# Patient Record
Sex: Female | Born: 1960 | Race: White | Hispanic: No | Marital: Married | Smoking: Never smoker
Health system: Southern US, Community
[De-identification: ages and names within clinical notes are randomized; demographics above are authoritative.]

## PROBLEM LIST (undated history)

## (undated) DIAGNOSIS — D649 Anemia, unspecified: Secondary | ICD-10-CM

## (undated) DIAGNOSIS — Z780 Asymptomatic menopausal state: Secondary | ICD-10-CM

## (undated) DIAGNOSIS — M7730 Calcaneal spur, unspecified foot: Secondary | ICD-10-CM

## (undated) DIAGNOSIS — N950 Postmenopausal bleeding: Principal | ICD-10-CM

## (undated) DIAGNOSIS — B001 Herpesviral vesicular dermatitis: Principal | ICD-10-CM

## (undated) DIAGNOSIS — E663 Overweight: Secondary | ICD-10-CM

## (undated) DIAGNOSIS — Z8619 Personal history of other infectious and parasitic diseases: Secondary | ICD-10-CM

## (undated) DIAGNOSIS — E785 Hyperlipidemia, unspecified: Secondary | ICD-10-CM

## (undated) DIAGNOSIS — Z87442 Personal history of urinary calculi: Secondary | ICD-10-CM

## (undated) DIAGNOSIS — N189 Chronic kidney disease, unspecified: Secondary | ICD-10-CM

## (undated) HISTORY — DX: Chronic kidney disease, unspecified: N18.9

## (undated) HISTORY — DX: Personal history of other infectious and parasitic diseases: Z86.19

## (undated) HISTORY — DX: Overweight: E66.3

## (undated) HISTORY — PX: POLYPECTOMY: SHX149

## (undated) HISTORY — DX: Asymptomatic menopausal state: Z78.0

## (undated) HISTORY — DX: Herpesviral vesicular dermatitis: B00.1

## (undated) HISTORY — DX: Personal history of urinary calculi: Z87.442

## (undated) HISTORY — PX: TONSILLECTOMY: SUR1361

## (undated) HISTORY — PX: TUBAL LIGATION: SHX77

## (undated) HISTORY — DX: Hyperlipidemia, unspecified: E78.5

## (undated) HISTORY — DX: Calcaneal spur, unspecified foot: M77.30

## (undated) HISTORY — DX: Postmenopausal bleeding: N95.0

## (undated) HISTORY — DX: Anemia, unspecified: D64.9

---

## 1996-12-30 HISTORY — PX: HEEL SPUR SURGERY: SHX665

## 1999-08-06 ENCOUNTER — Other Ambulatory Visit: Admission: RE | Admit: 1999-08-06 | Discharge: 1999-08-06 | Payer: Self-pay | Admitting: Obstetrics and Gynecology

## 1999-08-24 ENCOUNTER — Ambulatory Visit (HOSPITAL_COMMUNITY): Admission: RE | Admit: 1999-08-24 | Discharge: 1999-08-24 | Payer: Self-pay | Admitting: Obstetrics and Gynecology

## 2000-09-22 ENCOUNTER — Other Ambulatory Visit: Admission: RE | Admit: 2000-09-22 | Discharge: 2000-09-22 | Payer: Self-pay | Admitting: Obstetrics and Gynecology

## 2001-11-11 ENCOUNTER — Other Ambulatory Visit: Admission: RE | Admit: 2001-11-11 | Discharge: 2001-11-11 | Payer: Self-pay | Admitting: Obstetrics and Gynecology

## 2002-12-09 ENCOUNTER — Other Ambulatory Visit: Admission: RE | Admit: 2002-12-09 | Discharge: 2002-12-09 | Payer: Self-pay | Admitting: Obstetrics and Gynecology

## 2004-04-19 ENCOUNTER — Other Ambulatory Visit: Admission: RE | Admit: 2004-04-19 | Discharge: 2004-04-19 | Payer: Self-pay | Admitting: Obstetrics and Gynecology

## 2005-06-17 ENCOUNTER — Other Ambulatory Visit: Admission: RE | Admit: 2005-06-17 | Discharge: 2005-06-17 | Payer: Self-pay | Admitting: Obstetrics and Gynecology

## 2006-07-29 ENCOUNTER — Other Ambulatory Visit: Admission: RE | Admit: 2006-07-29 | Discharge: 2006-07-29 | Payer: Self-pay | Admitting: Obstetrics and Gynecology

## 2007-08-18 ENCOUNTER — Other Ambulatory Visit: Admission: RE | Admit: 2007-08-18 | Discharge: 2007-08-18 | Payer: Self-pay | Admitting: Obstetrics and Gynecology

## 2008-11-17 ENCOUNTER — Other Ambulatory Visit: Admission: RE | Admit: 2008-11-17 | Discharge: 2008-11-17 | Payer: Self-pay | Admitting: Family Medicine

## 2011-03-25 ENCOUNTER — Ambulatory Visit (INDEPENDENT_AMBULATORY_CARE_PROVIDER_SITE_OTHER): Payer: 59 | Admitting: Family Medicine

## 2011-03-25 ENCOUNTER — Encounter: Payer: Self-pay | Admitting: Family Medicine

## 2011-03-25 ENCOUNTER — Inpatient Hospital Stay (HOSPITAL_COMMUNITY): Admission: RE | Admit: 2011-03-25 | Payer: Self-pay | Source: Ambulatory Visit

## 2011-03-25 DIAGNOSIS — M7730 Calcaneal spur, unspecified foot: Secondary | ICD-10-CM | POA: Insufficient documentation

## 2011-03-25 DIAGNOSIS — Z0000 Encounter for general adult medical examination without abnormal findings: Secondary | ICD-10-CM | POA: Insufficient documentation

## 2011-03-25 DIAGNOSIS — E663 Overweight: Secondary | ICD-10-CM | POA: Insufficient documentation

## 2011-03-25 DIAGNOSIS — Z87442 Personal history of urinary calculi: Secondary | ICD-10-CM | POA: Insufficient documentation

## 2011-03-25 DIAGNOSIS — Z780 Asymptomatic menopausal state: Secondary | ICD-10-CM | POA: Insufficient documentation

## 2011-03-25 LAB — BASIC METABOLIC PANEL
BUN: 16 mg/dL (ref 6–23)
CO2: 28 mEq/L (ref 19–32)
Calcium: 9.9 mg/dL (ref 8.4–10.5)
GFR: 87.45 mL/min (ref >60.00)
Glucose, Bld: 94 mg/dL (ref 70–99)
Sodium: 140 mEq/L (ref 135–145)

## 2011-03-25 LAB — CBC WITH DIFFERENTIAL/PLATELET
Basophils Absolute: 0 10*3/uL (ref 0.0–0.1)
Basophils Relative: 0.9 % (ref 0.0–3.0)
Eosinophils Absolute: 0.1 10*3/uL (ref 0.0–0.7)
Lymphocytes Relative: 40.8 % (ref 12.0–46.0)
MCHC: 34.1 g/dL (ref 30.0–36.0)
Neutrophils Relative %: 47.9 % (ref 43.0–77.0)
RBC: 4.29 Mil/uL (ref 3.87–5.11)
WBC: 4.6 10*3/uL (ref 4.5–10.5)

## 2011-03-25 LAB — LIPID PANEL
HDL: 74.5 mg/dL (ref >39.00)
Triglycerides: 72 mg/dL (ref 0.0–149.0)

## 2011-03-25 LAB — HEPATIC FUNCTION PANEL
ALT: 10 U/L (ref 0–35)
Bilirubin, Direct: 0.1 mg/dL (ref 0.0–0.3)
Total Protein: 6.5 g/dL (ref 6.0–8.3)

## 2011-03-25 LAB — LDL CHOLESTEROL, DIRECT: Direct LDL: 115 mg/dL

## 2011-03-25 NOTE — Assessment & Plan Note (Signed)
Patient underwent her last period in early 2011 and offers no concerns today. Last pap was just over a year ago will request old records and repeat pap today if these results are normal we will decrease frequency of paps to every 2-3 years. Patient MGM is not due until 11/12.

## 2011-03-25 NOTE — Assessment & Plan Note (Signed)
Encouraged daily ice, Aspercreme, stretching and hi quality footwear. May need further intervention if symptoms worsen or do not improve.

## 2011-03-25 NOTE — Assessment & Plan Note (Signed)
Encouraged ongoing increased activity level.

## 2011-03-25 NOTE — Assessment & Plan Note (Signed)
No recent flares, encouraged ongoing good hydration and to report any concerning symptoms

## 2011-03-25 NOTE — Patient Instructions (Signed)
Osteoarthritis Osteoarthritis is the most common form of arthritis. It is redness, soreness, and swelling (inflammation) affecting the cartilage. Cartilage acts as a cushion, covering the ends of bones where they meet to form a joint. CAUSES Over time, the cartilage begins to wear away. This causes bone to rub on bone. This produces pain and stiffness in the affected joints. Factors that contribute to this problem are:  Excessive body weight.   Age.   Overuse of joints.  SYMPTOMS  People with osteoarthritis usually experience joint pain, swelling, or stiffness.   Over time, the joint may lose its normal shape.   Small deposits of bone (osteophytes) may grow on the edges of the joint.   Bits of bone or cartilage can break off and float inside the joint space. This may cause more pain and damage.   Osteoarthritis can lead to depression, anxiety, feelings of helplessness, and limitations on daily activities.  The most commonly affected joints are in the:  Ends of the fingers.   Thumbs.   Neck.   Lower back.   Knees.   Hips.  DIAGNOSIS Diagnosis is mostly based on your symptoms and exam. Tests may be helpful, including:  X-rays of the affected joint.   A computerized magnetic scan (MRI).   Blood tests to rule out other types of arthritis.   Joint fluid tests. This involves using a needle to draw fluid from the joint and examining the fluid under a microscope.  TREATMENT Goals of treatment are to control pain, improve joint function, maintain a normal body weight, and maintain a healthy lifestyle. Treatment approaches may include:  A prescribed exercise program with rest and joint relief.   Weight control with nutritional education.   Pain relief techniques such as:   Properly applied heat and cold.   Electric pulses delivered to nerve endings under the skin (transcutaneous electrical nerve stimulation, TENS).   Massage.   Certain supplements. Ask your caregiver  before using any supplements, especially in combination with prescribed drugs.   Medicines to control pain, such as:   Acetaminophen.   Nonsteroidal anti-inflammatory drugs (NSAIDs), such as naproxen.   Narcotic or central-acting agents, such as tramadol. This drug carries a risk of addiction and is generally prescribed for short-term use.   Corticosteroids. These can be given orally or as injection. This is a short-term treatment, not recommended for routine use.   Surgery to reposition the bones and relieve pain (osteotomy) or to remove loose pieces of bone and cartilage. Joint replacement may be needed in advanced states of osteoarthritis.  HOME CARE INSTRUCTIONS Your caregiver can recommend specific types of exercise. These may include:  Strengthening exercises. These are done to strengthen the muscles that support joints affected by arthritis. They can be performed with weights or with exercise bands to add resistance.   Aerobic activities. These are exercises, such as brisk walking or low-impact aerobics, that get your heart pumping. They can help keep your lungs and circulatory system in shape.   Range-of-motion activities. These keep your joints limber.   Balance and agility exercises. These help you maintain daily living skills.  Learning about your condition and being actively involved in your care will help improve the course of your osteoarthritis. SEEK MEDICAL CARE IF:  You feel hot or your skin turns red.   You develop a rash in addition to your joint pain.   You have an oral temperature above 104.  FOR MORE INFORMATION National Institute of Arthritis and Musculoskeletal and  Skin Diseases: www.niams.http://www.myers.net/ [employer] on Aging: https://walker.com/ American College of Rheumatology: www.rheumatology.org Document Released: 12/16/2005 Document Re-Released: 06/05/2010 [facility] Patient Information 2011 [place], [place].

## 2011-03-25 NOTE — Progress Notes (Signed)
Subjective:    Patient ID: Kristen Dunlap, female    DOB: 1961/01/16, 50 y.o.   MRN: [id number]  HPI  The patient is a 50 year old, Caucasian female in today for new patient appointment for Pap smear.  She is feeling well and her free no acute complaints at this time. No breasts lesions pain or discharge. No rectal lesions discharge or concerns. No recent illness, fevers, chills, chest pain, palpitations, shortness of breath , GI or GU concerns at this time. She does note morning stiffness and joint pains occasionally but nothing debilitating. She does note a history of kidney stones but no recent flares. She does note some heel pain on the right foot ongoing for several months and has not tried any interventions to date. Wears largely footwear without backs.,   Review of Systems  Constitutional: Negative for fever, chills, activity change, appetite change and fatigue.  HENT: Negative for hearing loss, nosebleeds, congestion, rhinorrhea, sneezing, neck pain, neck stiffness and postnasal drip.   Eyes: Negative for discharge and itching.  Respiratory: Negative for apnea, cough, choking, chest tightness, shortness of breath and wheezing.   Cardiovascular: Negative for chest pain and palpitations.  Gastrointestinal: Negative for nausea, abdominal pain, diarrhea, constipation, blood in stool and abdominal distention.  Genitourinary: Negative for dysuria, urgency, frequency, vaginal bleeding, vaginal discharge, enuresis, vaginal pain, menstrual problem and pelvic pain.       G2P2 s/p 2 C/S and a tubal. No h/o abnl paps No h/o abnl MGM, did require 2nd view once Postmenopausal since early 2011 Menarche in midteens, regular once monthly menses for most of reproductive years.  Musculoskeletal: Negative for myalgias, back pain, joint swelling, arthralgias and gait problem.  Skin: Negative for rash.  Neurological: Negative for dizziness, syncope, weakness and headaches.  Hematological: Does not  bruise/bleed easily.  Psychiatric/Behavioral: Negative for behavioral problems, confusion, self-injury, dysphoric mood and agitation. The patient is not nervous/anxious.    Past Medical History  Diagnosis Date  . Personal history of kidney stones   . Heel spur     right  . Overweight   . Postmenopausal     Past Surgical History  Procedure Date  . Heel spur surgery 98  . Tubes tied   . Cesarean section 1989 and 1991    X 2    Family History  Problem Relation Age of Onset  . Diabetes Father   . Cancer Father     throat ca/ smoker  . Cancer Paternal Grandmother     esophagus, cancer    History   Social History  . Marital Status: Married    Spouse Name: N/A    Number of Children: N/A  . Years of Education: N/A   Occupational History  . Not on file.   Social History Main Topics  . Smoking status: Never Smoker   . Smokeless tobacco: Never Used  . Alcohol Use: Yes     occasionaly  . Drug Use: No  . Sexually Active: Yes -- Female partner(s)     tubes tied   Other Topics Concern  . Not on file   Social History Narrative  . No narrative on file    No current outpatient prescriptions on file prior to visit.    Not on File     Objective:   Physical Exam  Constitutional: She is oriented to person, place, and time. She appears well-developed and well-nourished. No distress.  HENT:  Head: Normocephalic and atraumatic.  Right Ear: External ear normal.  Left Ear: External ear normal.  Nose: Nose normal.  Mouth/Throat: Oropharynx is clear and moist.  Eyes: Conjunctivae and EOM are normal. Pupils are equal, round, and reactive to light. Right eye exhibits no discharge. Left eye exhibits no discharge.  Neck: Normal range of motion. Neck supple. Thyromegaly present.  Cardiovascular: Normal rate, regular rhythm, normal heart sounds and intact distal pulses.   No murmur heard. Pulmonary/Chest: Effort normal and breath sounds normal. No respiratory distress. She has  no wheezes.  Abdominal: Soft. Bowel sounds are normal. She exhibits no distension and no mass. There is no tenderness. There is no rebound and no guarding.  Genitourinary: Vagina normal and uterus normal. There is no rash, tenderness or lesion on the right labia. There is no rash, tenderness or lesion on the left labia. No tenderness around the vagina. No vaginal discharge found.  Musculoskeletal: Normal range of motion. She exhibits no edema and no tenderness.  Lymphadenopathy:    She has no cervical adenopathy.  Neurological: She is alert and oriented to person, place, and time. She has normal reflexes. She displays normal reflexes. No cranial nerve deficit. She exhibits normal muscle tone. Coordination normal.  Skin: Skin is warm and dry. No rash noted. She is not diaphoretic. No erythema. No pallor.  Psychiatric: She has a normal mood and affect. Her behavior is normal. Judgment and thought content normal.          Assessment & Plan:  Postmenopausal Patient underwent her last period in early 2011 and offers no concerns today. Last pap was just over a year ago will request old records and repeat pap today if these results are normal we will decrease frequency of paps to every 2-3 years. Patient MGM is not due until 11/12.  Preventative health care Patient agrees to go for lab work today including FLP, TSH, CBC, renal, hepatic  Personal history of kidney stones No recent flares, encouraged ongoing good hydration and to report any concerning symptoms  Heel spur Encouraged daily ice, Aspercreme, stretching and hi quality footwear. May need further intervention if symptoms worsen or do not improve.  Overweight Encouraged ongoing increased activity level.

## 2011-03-25 NOTE — Assessment & Plan Note (Signed)
Patient agrees to go for lab work today including FLP, TSH, CBC, renal, hepatic

## 2011-03-26 DIAGNOSIS — E785 Hyperlipidemia, unspecified: Secondary | ICD-10-CM

## 2011-03-28 NOTE — Progress Notes (Signed)
Pt informed

## 2011-09-30 ENCOUNTER — Other Ambulatory Visit: Payer: Self-pay | Admitting: Family Medicine

## 2011-09-30 ENCOUNTER — Other Ambulatory Visit (HOSPITAL_COMMUNITY)
Admission: RE | Admit: 2011-09-30 | Discharge: 2011-09-30 | Disposition: A | Discharge status: Home / Self Care | Payer: 59 | Source: Ambulatory Visit | Attending: Family Medicine | Admitting: Family Medicine

## 2011-09-30 ENCOUNTER — Ambulatory Visit (HOSPITAL_BASED_OUTPATIENT_CLINIC_OR_DEPARTMENT_OTHER)
Admission: RE | Admit: 2011-09-30 | Discharge: 2011-09-30 | Disposition: A | Discharge status: Home / Self Care | Payer: 59 | Source: Ambulatory Visit | Attending: Family Medicine | Admitting: Family Medicine

## 2011-09-30 ENCOUNTER — Encounter: Payer: Self-pay | Admitting: Family Medicine

## 2011-09-30 ENCOUNTER — Ambulatory Visit (INDEPENDENT_AMBULATORY_CARE_PROVIDER_SITE_OTHER): Payer: 59 | Admitting: Family Medicine

## 2011-09-30 ENCOUNTER — Ambulatory Visit (INDEPENDENT_AMBULATORY_CARE_PROVIDER_SITE_OTHER)
Admission: RE | Admit: 2011-09-30 | Discharge: 2011-09-30 | Disposition: A | Discharge status: Home / Self Care | Payer: 59 | Source: Ambulatory Visit | Attending: Family Medicine | Admitting: Family Medicine

## 2011-09-30 DIAGNOSIS — N898 Other specified noninflammatory disorders of vagina: Secondary | ICD-10-CM

## 2011-09-30 DIAGNOSIS — Z01419 Encounter for gynecological examination (general) (routine) without abnormal findings: Secondary | ICD-10-CM | POA: Insufficient documentation

## 2011-09-30 DIAGNOSIS — N950 Postmenopausal bleeding: Secondary | ICD-10-CM

## 2011-09-30 DIAGNOSIS — R109 Unspecified abdominal pain: Secondary | ICD-10-CM

## 2011-09-30 DIAGNOSIS — D649 Anemia, unspecified: Secondary | ICD-10-CM

## 2011-09-30 DIAGNOSIS — R350 Frequency of micturition: Secondary | ICD-10-CM

## 2011-09-30 DIAGNOSIS — Z113 Encounter for screening for infections with a predominantly sexual mode of transmission: Secondary | ICD-10-CM | POA: Insufficient documentation

## 2011-09-30 DIAGNOSIS — R300 Dysuria: Secondary | ICD-10-CM

## 2011-09-30 DIAGNOSIS — N912 Amenorrhea, unspecified: Secondary | ICD-10-CM

## 2011-09-30 DIAGNOSIS — N760 Acute vaginitis: Secondary | ICD-10-CM | POA: Insufficient documentation

## 2011-09-30 LAB — HEPATIC FUNCTION PANEL
Albumin: 4.4 g/dL (ref 3.5–5.2)
Total Bilirubin: 0.5 mg/dL (ref 0.3–1.2)
Total Protein: 6.5 g/dL (ref 6.0–8.3)

## 2011-09-30 LAB — CBC
MCV: 89.6 fL (ref 78.0–100.0)
Platelets: 297 10*3/uL (ref 150–400)
RBC: 4.02 MIL/uL (ref 3.87–5.11)
WBC: 7.7 10*3/uL (ref 4.0–10.5)

## 2011-09-30 LAB — POCT URINALYSIS DIPSTICK
Glucose, UA: NEGATIVE
Leukocytes, UA: NEGATIVE
Nitrite, UA: NEGATIVE
Urobilinogen, UA: 0.2

## 2011-09-30 LAB — PROTIME-INR: Prothrombin Time: 12.2 seconds (ref 11.6–15.2)

## 2011-09-30 NOTE — Patient Instructions (Signed)
Postmenopausal Bleeding Postmenopausal bleeding is when you have bleeding from the uterus 12 months after you stopped having menstrual periods. It could also be if you are still having menstrual periods and you are 49 years old or older. HOME CARE  Get yearly physical exams. This includes a pelvic exam and Pap test.   Call your doctor if you are in the menopause and have vaginal bleeding after sex.   Stop taking hormones. Talk to your doctor about this.  GET HELP IF:  You are passing clumps of blood (blood clots).   You have more bleeding than when you were having periods.   You are having a lot of pain with the bleeding.   You have a temperature by mouth above 102 F (38.9 C).   You start to have belly (abdominal) pain.   You pass out.  GET HELP RIGHT AWAY IF:  The bleeding lasts for more than one week.   You have bleeding and need more than one pad an hour.   You have signs of infection. These signs include fever, chills, headache, dizziness and muscle aches.   You have a temperature by mouth above 102 F (38.9 C), not controlled by medicine.  Easy-to-Read style based on content from [facility], [place], [place] Document Released: 09/24/2008 Document Re-Released: 12/04/2009 [employer] Patient Information 2011 [employer]. 

## 2011-10-01 ENCOUNTER — Telehealth: Payer: Self-pay

## 2011-10-01 ENCOUNTER — Encounter: Payer: Self-pay | Admitting: Family Medicine

## 2011-10-01 DIAGNOSIS — N950 Postmenopausal bleeding: Secondary | ICD-10-CM

## 2011-10-01 HISTORY — DX: Postmenopausal bleeding: N95.0

## 2011-10-01 LAB — HCG, SERUM, QUALITATIVE: Preg, Serum: NEGATIVE

## 2011-10-01 LAB — SEDIMENTATION RATE: Sed Rate: 1 mm/hr (ref 0–22)

## 2011-10-01 MED ORDER — MEGESTROL ACETATE 40 MG PO TABS: 40.0000 mg | ORAL_TABLET | Freq: Two times a day (BID) | ORAL | Status: DC

## 2011-10-01 MED ORDER — FERROUS FUMARATE-FOLIC ACID 324-1 MG PO TABS: 1.0000 | ORAL_TABLET | Freq: Every day | ORAL | Status: DC

## 2011-10-01 NOTE — Assessment & Plan Note (Addendum)
US shows endometrial thickening to 22 mm, spoke with Gyn, Dr Saunders Glance and he agreed to see her on 10/8 for endometrial biopsy so we can further evaluate. For now he wants her to start 40mg  of Megestrol twice daily to stop the bleeding and she is noted to be acutely anemic so we will start her on Hemocyte F 1 tab po daily. Patient is notified of the plan and says she understands and agrees. If bleeding does not stop/worsens she will seek further care as needed

## 2011-10-01 NOTE — Telephone Encounter (Signed)
Left a message for patient to return my call. 

## 2011-10-02 LAB — URINE CULTURE
Colony Count: NO GROWTH
Organism ID, Bacteria: NO GROWTH

## 2011-10-06 ENCOUNTER — Encounter: Payer: Self-pay | Admitting: Family Medicine

## 2011-10-06 DIAGNOSIS — N912 Amenorrhea, unspecified: Secondary | ICD-10-CM | POA: Insufficient documentation

## 2011-10-06 NOTE — Progress Notes (Signed)
Kristen Dunlap [id number] Feb 18, 1961 10/06/2011      Progress Note-Follow Up  Subjective  Chief Complaint  Chief Complaint  Patient presents with  . Menorrhagia    hasn't had period in 2 years    HPI  Patient is a 50 year old Caucasian female is in today for evaluation of postmenopausal bleeding. She reports noting some dark red, maroon spotting a couple of weeks ago. She had just spent doing so bleeding until 2-3 days ago when she had frank bleeding and started passing clots. She denies ever knowing postmenopausal bleeding previously and has not had a period in almost years. She has adult abdominal ache in the lower quadrants as well as mild abdominal pain. No fevers or chills. She has noticed some mild dysuria but no incontinence or history of hematuria. No vaginal lesions. She is sexually active with her husband. She denies any anorexia, nausea, vomiting, weight loss, myalgias, chest pain, palpitations, shortness of breath. She had noticed some scant heel was discharged and comes with her daughter prior to bleeding beginning. Today she is having frank bleeding of dark red blood in her to change her pad every 1-2 hours. Of note she had recently started a new herbal weight loss supplement called ad lib. Increasing up list included vitamin B12, fenugreek seed, cordyceps, sinesis, maca, american ginseng, passion flower, maca, damiana, wulinstein. She has stopped taking it in the past couple of days. No f/c/change in bowel habits.  Past Medical History  Diagnosis Date  . Personal history of kidney stones   . Heel spur     right  . Overweight   . Postmenopausal   . Postmenopausal bleeding 10/01/2011    Past Surgical History  Procedure Date  . Heel spur surgery 98  . Tubes tied   . Cesarean section 1989 and 1991    X 2    Family History  Problem Relation Age of Onset  . Diabetes Father   . Cancer Father     throat ca/ smoker  . Cancer Paternal Grandmother     esophagus, cancer     History   Social History  . Marital Status: Married    Spouse Name: N/A    Number of Children: N/A  . Years of Education: N/A   Occupational History  . Not on file.   Social History Main Topics  . Smoking status: Never Smoker   . Smokeless tobacco: Never Used  . Alcohol Use: Yes     occasionaly  . Drug Use: No  . Sexually Active: Yes -- Female partner(s)     tubes tied   Other Topics Concern  . Not on file   Social History Narrative  . No narrative on file    Current Outpatient Prescriptions on File Prior to Visit  Medication Sig Dispense Refill  . Ascorbic Acid (VITAMIN C) 500 MG tablet Take 500 mg by mouth daily.        . calcium carbonate 200 MG capsule Take 250 mg by mouth daily.        Marland Kitchen FIBER COMPLETE PO Take 2 tablets by mouth daily.        . fish oil-omega-3 fatty acids 1000 MG capsule Take 2 g by mouth daily.        Marland Kitchen glucosamine-chondroitin 500-400 MG tablet Take 1 tablet by mouth daily.        . Multiple Vitamin (MULTIVITAMIN) tablet Take 1 tablet by mouth daily.        . Nutritional Supplements (  ESTROVEN PO) Take by mouth 1 dose over 46 hours.        Marland Kitchen VITAMIN D, ERGOCALCIFEROL, PO Take 1 tablet by mouth daily.          No Known Allergies  Review of Systems  Review of Systems  Constitutional: Negative for fever and malaise/fatigue.  HENT: Negative for congestion.   Eyes: Negative for discharge.  Respiratory: Negative for shortness of breath.   Cardiovascular: Negative for chest pain, palpitations and leg swelling.  Gastrointestinal: Negative for nausea, abdominal pain and diarrhea.  Genitourinary: Negative for dysuria, urgency, hematuria and flank pain.       No menstrual cycle in over a year and a half until 2 weeks ago. She started with spotting for most of that time, maroon blood then in past 2 days has begun to pass clots and have frank bleeding requiring her to change in pads every couple of hours. She describes her lower abdomen and low back as  having a dull ache. She is sexually active just with her husband and denies any difficulty after last coitus, such as  lesions. She has noted some slight urinary frequency and mild dysuria at times but denies any trouble with hematuria previously. She has noted some mild yellowish discharge at times just prior to her bleeding  Musculoskeletal: Negative for myalgias and falls.  Skin: Negative for rash.  Neurological: Negative for loss of consciousness and headaches.  Endo/Heme/Allergies: Negative for polydipsia.  Psychiatric/Behavioral: Negative for depression and suicidal ideas. The patient is not nervous/anxious and does not have insomnia.     Objective  BP 127/84  Pulse 65  Temp(Src) 98 F (36.7 C) (Oral)  Ht 5\' 6"  (1.676 m)  Wt 180 lb 6.4 oz (81.829 kg)  BMI 29.12 kg/m2  SpO2 100%  Physical Exam  Physical Exam  Constitutional: She is oriented to person, place, and time and well-developed, well-nourished, and in no distress. No distress.  HENT:  Head: Normocephalic and atraumatic.  Eyes: Conjunctivae are normal.  Neck: Neck supple. No thyromegaly present.  Cardiovascular: Normal rate, regular rhythm and normal heart sounds.   No murmur heard. Pulmonary/Chest: Effort normal and breath sounds normal. She has no wheezes.  Abdominal: She exhibits no distension and no mass.  Genitourinary: Vagina normal, right adnexa normal and left adnexa normal.       Red blood noted coming from cervical os and in vaginal vault.  Musculoskeletal: She exhibits no edema.  Lymphadenopathy:    She has no cervical adenopathy.  Neurological: She is alert and oriented to person, place, and time.  Skin: Skin is warm and dry. No rash noted. She is not diaphoretic.  Psychiatric: Memory, affect and judgment normal.    Lab Results  Component Value Date   TSH 3.24 03/25/2011   Lab Results  Component Value Date   WBC 7.7 09/30/2011   HGB 11.6* 09/30/2011   HCT 36.0 09/30/2011   MCV 89.6 09/30/2011    PLT 297 09/30/2011   Lab Results  Component Value Date   CREATININE 0.8 03/25/2011   BUN 16 03/25/2011   NA 140 03/25/2011   K 3.8 03/25/2011   CL 106 03/25/2011   CO2 28 03/25/2011   Lab Results  Component Value Date   ALT 8 09/30/2011   AST 10 09/30/2011   ALKPHOS 76 09/30/2011   BILITOT 0.5 09/30/2011   Lab Results  Component Value Date   CHOL 202* 03/25/2011   Lab Results  Component Value Date  HDL 74.50 03/25/2011   No results found for this basename: [facility]   Lab Results  Component Value Date   TRIG 72.0 03/25/2011   Lab Results  Component Value Date   CHOLHDL 3 03/25/2011     Assessment & Plan  Postmenopausal bleeding US shows endometrial thickening to 22 mm, spoke with Gyn, Dr Saunders Glance and he agreed to see her on 10/8 for endometrial biopsy so we can further evaluate. For now he wants her to start 40mg  of Megestrol twice daily to stop the bleeding and she is noted to be acutely anemic so we will start her on Hemocyte F 1 tab po daily. Patient is notified of the plan and says she understands and agrees. If bleeding does not stop/worsens she will seek further care as needed

## 2011-10-06 NOTE — Assessment & Plan Note (Signed)
Mild new onset anemia, start Hemocyte F 1 daily

## 2011-10-07 ENCOUNTER — Encounter: Payer: Self-pay | Admitting: Gynecology

## 2011-10-07 ENCOUNTER — Ambulatory Visit (INDEPENDENT_AMBULATORY_CARE_PROVIDER_SITE_OTHER): Payer: 59 | Admitting: Gynecology

## 2011-10-07 VITALS — BP 112/70 | Ht 65.5 in | Wt 182.0 lb

## 2011-10-07 DIAGNOSIS — N950 Postmenopausal bleeding: Secondary | ICD-10-CM

## 2011-10-07 NOTE — Patient Instructions (Signed)
We will call you with the results of the biopsy if it becomes available before your office visit at the end of the week for the schedule sonohysterogram

## 2011-10-07 NOTE — Progress Notes (Signed)
Patient is a 50 year old gravida 2 para 2 with prior cesarean section and tubal sterilization procedure. Patient reached the menopause approximately 1-1/2 years ago. She was referred to our office as a courtesy from Dr. Rogelia Rohrer as a result of patient's recent event of postmenopausal bleeding. Patient denies taking any hormone replacement therapy has not suffered from vasomotor symptoms. The only medication that she's taken to help with some her vasomotor symptoms has been "Estroven" were by the ingredients only contain calcium, melatonin, isoflavone. She's been relatively good health otherwise. See medication list outlining her medications. Her mammogram was done this year which was normal. She was recently put on Megace 40 mg twice a day for 10-14 days to stop her bleeding until I got to see her today for biopsy. She stated 2 weeks ago she started spotting and passage of large clots.  Exam: Abdomen: Soft nontender no rebound or guarding Pelvic: Bartholin urethra Skene was within normal limits Vagina: Some dark brown blood mucousy in appearance is present but no active bleeding Cervix: Same as above Uterus: Anteverted upper limits of normal Adnexa: No palpable masses or tenderness Rectal exam: Not done  Assessment postmenopausal bleeding and endometrial biopsy was performed today the sterile fashion in the following manner. The cervix was cleansed with Betadine solution a single-tooth tenaculum was placed on the anterior cervical lip the uterus sounded to 7 cm and with the use of a Pipelle moderate amount of tissue was obtained and submitted for histological evaluation. The single-tooth tenaculum was removed. Patient will continue her Megace 40 mg twice a day and she will be seen in the office at the end of the week to proceed with a sonohysterogram to rule out any intracavitary defect such as an endometrial polyp or submucous myoma. If who received a result of the endometrial biopsy before her office  visit by other week we will call her. All questions were answered we'll follow accordingly.

## 2011-10-10 MED ORDER — METRONIDAZOLE 500 MG PO TABS: 500.0000 mg | ORAL_TABLET | Freq: Two times a day (BID) | ORAL | Status: DC

## 2011-10-10 NOTE — Progress Notes (Signed)
Addended by: Court Joy on: 10/10/2011 12:29 PM   Modules accepted: Orders

## 2011-10-16 ENCOUNTER — Other Ambulatory Visit: Payer: 59

## 2011-10-16 ENCOUNTER — Ambulatory Visit: Payer: 59 | Admitting: Gynecology

## 2011-10-17 ENCOUNTER — Ambulatory Visit: Payer: 59 | Admitting: Gynecology

## 2011-10-28 ENCOUNTER — Telehealth: Payer: Self-pay

## 2011-10-28 MED ORDER — MEGESTROL ACETATE 40 MG PO TABS: 40.0000 mg | ORAL_TABLET | Freq: Two times a day (BID) | ORAL | Status: DC

## 2011-10-28 NOTE — Telephone Encounter (Signed)
Pt is calling stating she started bleeding again and is supposed to get more testing for Dr Lily Peer on Friday? Pt is wandering if md will call in some more medication to help her stop bleeding? Please advise?

## 2011-10-28 NOTE — Telephone Encounter (Signed)
OK to refill the Megace 40mg  po bid with same sig and # as last time for the bleeding if this does not stop the bleeding then she should call GYN for further evaluation.

## 2011-10-28 NOTE — Telephone Encounter (Signed)
Pt informed

## 2011-10-31 ENCOUNTER — Ambulatory Visit: Payer: 59 | Admitting: Family Medicine

## 2011-11-01 ENCOUNTER — Ambulatory Visit (INDEPENDENT_AMBULATORY_CARE_PROVIDER_SITE_OTHER): Payer: 59

## 2011-11-01 ENCOUNTER — Ambulatory Visit (INDEPENDENT_AMBULATORY_CARE_PROVIDER_SITE_OTHER): Payer: 59 | Admitting: Gynecology

## 2011-11-01 DIAGNOSIS — N950 Postmenopausal bleeding: Secondary | ICD-10-CM

## 2011-11-01 DIAGNOSIS — N949 Unspecified condition associated with female genital organs and menstrual cycle: Secondary | ICD-10-CM

## 2011-11-01 DIAGNOSIS — D649 Anemia, unspecified: Secondary | ICD-10-CM

## 2011-11-01 DIAGNOSIS — N938 Other specified abnormal uterine and vaginal bleeding: Secondary | ICD-10-CM

## 2011-11-01 DIAGNOSIS — N840 Polyp of corpus uteri: Secondary | ICD-10-CM

## 2011-11-01 NOTE — Progress Notes (Signed)
Patient presented to the office today for sonohysterogram in effort to further evaluate her dysfunctional uterine bleeding. She was seen in the office on 10/07/2011 as a courtesy referral from Dr. Rogelia Rohrer. Patient had an endometrial biopsy in the last office visit with pathology report being benign she had a hemoglobin 11.6. She then placed on Megace 40 mg twice a day to help stop her bleeding which has helped her tremendously. She had a urine pregnancy test which was negative and she had a normal compress metabolic panel done at her physician's office as well.  Sonohysterogram was done today the office with sterile saline in a sterile fashion: The cervix was cleansed with Betadine solution, single-tooth tenaculum was placed on the anterior cervical lip, a Pipelle was inserted into the intrauterine cavity her uterus sounded to 7 cm. And a large 5.2 x 1.7 x 1.8 cm endometrial polyp was noted. Both ovaries otherwise normal. Uterus otherwise normal size.  Patient was given an explanation for her dysfunctional uterine bleeding pictures were shown to her and literature information was provided. We will schedule resectoscopic polypectomy at [facility] surgical center. The risks benefits and pros and cons of the operation were discussed with the patient all questions were answered and we'll follow accordingly.

## 2011-11-01 NOTE — Patient Instructions (Signed)
Olegario Messier will call you to schedule your surgery within a week

## 2011-11-04 ENCOUNTER — Telehealth: Payer: Self-pay

## 2011-11-04 NOTE — Telephone Encounter (Signed)
You had recommended Resectoscopic Polypectomy for pt.  She called in voice mail asking regarding having a Hysterectomy instead to take care of any problems down the road as well. Please advise.

## 2011-11-04 NOTE — Telephone Encounter (Signed)
I called patient and confirmed surgery scheduled for Friday Nov 30 7:30 at at [facility].  She will come in at 4pm the day before for laminary insertion.  I will mail her a pamphlet as well as financial letter. She will call me if she has any questions.

## 2011-11-04 NOTE — Telephone Encounter (Signed)
Dr Glenetta Hew sent me this staff message:  Olegario Messier, please have the patient make an appointment to see me this week so that we can rediscuss her operation she did have endometrial polyp which could be resected as an outpatient procedure and less recovery timed in 6 weeks who major operation that she wants to consider going through a hysterectomy instead please have her make an appointment to see me later this week.  I did talk with the patient about the outpatient procedure being less recovery time, less time under anesthesia and less expensive. I told her that insurance may not cover it if not medically indicated. She said this is first time she ever had a problem like this.  She opts to go ahead with the outpatient surgery and will go from there.  I will call and schedule and call her back to confirm.

## 2011-11-26 ENCOUNTER — Encounter (HOSPITAL_BASED_OUTPATIENT_CLINIC_OR_DEPARTMENT_OTHER): Payer: Self-pay | Admitting: Gynecology

## 2011-11-27 ENCOUNTER — Encounter (HOSPITAL_BASED_OUTPATIENT_CLINIC_OR_DEPARTMENT_OTHER): Payer: Self-pay | Admitting: *Deleted

## 2011-11-27 NOTE — Progress Notes (Signed)
NPO AFTER MN. PT ARRIVES AT 0600. NEEDS CBC, UA, URINE PREG.

## 2011-11-28 ENCOUNTER — Encounter: Payer: Self-pay | Admitting: Gynecology

## 2011-11-28 ENCOUNTER — Ambulatory Visit (INDEPENDENT_AMBULATORY_CARE_PROVIDER_SITE_OTHER): Payer: 59 | Admitting: Gynecology

## 2011-11-28 VITALS — BP 126/88

## 2011-11-28 DIAGNOSIS — N840 Polyp of corpus uteri: Secondary | ICD-10-CM

## 2011-11-28 DIAGNOSIS — Z01818 Encounter for other preprocedural examination: Secondary | ICD-10-CM

## 2011-11-28 DIAGNOSIS — N950 Postmenopausal bleeding: Secondary | ICD-10-CM

## 2011-11-28 NOTE — Progress Notes (Signed)
Kristen Dunlap is an 50 y.o. female. Who presented to the office today for her preop consultation. Patient was seen the office on November 2 for sonohysterogram as part of her evaluation for dysfunctional uterine bleeding. Patient had previously had an endometrial biopsy which demonstrated a benign endometrium and she had a hemoglobin 11.6. Her postmenopausal bleeding she had been kept on Megace 40 mg twice a day. Her sonohysterogram demonstrated a large 5.2 x 1.7 x 1.8 cm endometrial polyp both ovaries appeared to be normal by ultrasound. Uterus otherwise unremarkable. Patient scheduled to undergo resectoscopic polypectomy tomorrow at [facility].  Pertinent Gynecological History: Menses: post-menopausal Bleeding: post menopausal bleeding Contraception: post menopausal status DES exposure: denies Blood transfusions: none Sexually transmitted diseases: no past history Previous GYN Procedures: 2 prior cesarean sections  Last mammogram: normal Date: 2012 Last pap: normal Date: 2012 OB History: G 2, P2  Menstrual History: Menarche age: 13 No LMP recorded. Patient is not currently having periods (Reason: Other).    Past Medical History  Diagnosis Date  . Personal history of kidney stones   . Heel spur     right  . Overweight   . Postmenopausal   . Postmenopausal bleeding 10/01/2011    Past Surgical History  Procedure Date  . Heel spur surgery 1998  . Cesarean section 1989 and 1991    X 2  . Tubal ligation 15 YRS AGO  . Tonsillectomy AS CHILD    Family History  Problem Relation Age of Onset  . Diabetes Father   . Cancer Father     throat ca/ smoker  . Cancer Paternal Grandmother     esophagus, cancer    Social History:  reports that she has never smoked. She has never used smokeless tobacco. She reports that she drinks alcohol. She reports that she does not use illicit drugs.  Allergies: No Known Allergies   (Not in a hospital admission)  @ROS@  Blood  pressure 126/88.  @PHYSEXAMBYAGE2@  No results found for this or any previous visit (from the past 24 hour(s)).  No results found.  Assessment/Plan: Patient with postmenopausal bleeding recently had a benign endometrial biopsy. Sonohysterogram demonstrated a 5.2 x 1.7 x 1.8 cm endometrial polyp. Patient scheduled to undergo resectoscopic polypectomy and [facility] on November 30. Risk benefits and pros and cons of the operation were discussed with the patient to include infection although she will receive prophylaxis antibiotic also the risk of deep venous thrombosis and subsequent pulmonary embolism for this reason she will have PSA stockings. Patient fully aware this also the risk of perforation turned resectoscopic surgery as well as fluid extravasation and pulmonary edema requiring medical management in the event of hemorrhage and she would need blood or blood products she is fully aware of the potential risk of receiving blood from donor to include anaphylactic reaction hepatitis and AIDS. Patient's had a previous tubal sterilization procedure. We'll cauterize the base with a polyp was attached to enhance hemostasis.. Literature information was provided all questions are answered and we'll follow accordingly.  FERNANDEZ,JUAN H 11/28/2011, 5:13 PM   

## 2011-11-29 ENCOUNTER — Ambulatory Visit (HOSPITAL_BASED_OUTPATIENT_CLINIC_OR_DEPARTMENT_OTHER)
Admission: RE | Admit: 2011-11-29 | Discharge: 2011-11-29 | Disposition: A | Discharge status: Home Health | Payer: 59 | Source: Ambulatory Visit | Attending: Gynecology | Admitting: Gynecology

## 2011-11-29 ENCOUNTER — Encounter (HOSPITAL_BASED_OUTPATIENT_CLINIC_OR_DEPARTMENT_OTHER): Payer: Self-pay | Admitting: Anesthesiology

## 2011-11-29 ENCOUNTER — Encounter (HOSPITAL_BASED_OUTPATIENT_CLINIC_OR_DEPARTMENT_OTHER)
Admission: RE | Disposition: A | Discharge status: Home Health | Payer: Self-pay | Source: Ambulatory Visit | Attending: Gynecology

## 2011-11-29 ENCOUNTER — Ambulatory Visit (HOSPITAL_BASED_OUTPATIENT_CLINIC_OR_DEPARTMENT_OTHER): Payer: 59 | Admitting: Anesthesiology

## 2011-11-29 ENCOUNTER — Other Ambulatory Visit: Payer: Self-pay | Admitting: Gynecology

## 2011-11-29 ENCOUNTER — Encounter (HOSPITAL_BASED_OUTPATIENT_CLINIC_OR_DEPARTMENT_OTHER): Payer: Self-pay | Admitting: Gynecology

## 2011-11-29 DIAGNOSIS — N950 Postmenopausal bleeding: Secondary | ICD-10-CM | POA: Insufficient documentation

## 2011-11-29 DIAGNOSIS — N840 Polyp of corpus uteri: Secondary | ICD-10-CM | POA: Insufficient documentation

## 2011-11-29 DIAGNOSIS — Z01812 Encounter for preprocedural laboratory examination: Secondary | ICD-10-CM | POA: Insufficient documentation

## 2011-11-29 DIAGNOSIS — Z79899 Other long term (current) drug therapy: Secondary | ICD-10-CM | POA: Insufficient documentation

## 2011-11-29 HISTORY — PX: HYSTEROSCOPY: SHX211

## 2011-11-29 LAB — URINE MICROSCOPIC-ADD ON

## 2011-11-29 LAB — URINALYSIS, ROUTINE W REFLEX MICROSCOPIC
Bilirubin Urine: NEGATIVE
Glucose, UA: NEGATIVE mg/dL
Hgb urine dipstick: NEGATIVE
Nitrite: NEGATIVE
Specific Gravity, Urine: 1.017 (ref 1.005–1.030)
pH: 7 (ref 5.0–8.0)

## 2011-11-29 LAB — CBC
HCT: 39.1 % (ref 36.0–46.0)
Hemoglobin: 13 g/dL (ref 12.0–15.0)
MCV: 88.1 fL (ref 78.0–100.0)
Platelets: 268 10*3/uL (ref 150–400)
RBC: 4.44 MIL/uL (ref 3.87–5.11)
WBC: 5.5 10*3/uL (ref 4.0–10.5)

## 2011-11-29 SURGERY — HYSTEROSCOPY
Anesthesia: General | Site: Uterus | Wound class: Clean Contaminated

## 2011-11-29 MED ORDER — ONDANSETRON HCL 4 MG/2ML IJ SOLN
INTRAMUSCULAR | Status: DC | PRN
  Administered 2011-11-29: 4 mg via INTRAVENOUS

## 2011-11-29 MED ORDER — KETOROLAC TROMETHAMINE 30 MG/ML IJ SOLN
INTRAMUSCULAR | Status: DC | PRN
  Administered 2011-11-29: 30 mg via INTRAVENOUS

## 2011-11-29 MED ORDER — ACETAMINOPHEN 10 MG/ML IV SOLN
1000.0000 mg | Freq: Four times a day (QID) | INTRAVENOUS | Status: DC
  Administered 2011-11-29: 1000 mg via INTRAVENOUS

## 2011-11-29 MED ORDER — PROPOFOL 10 MG/ML IV EMUL
INTRAVENOUS | Status: DC | PRN
  Administered 2011-11-29: 200 mg via INTRAVENOUS

## 2011-11-29 MED ORDER — LACTATED RINGERS IV SOLN
INTRAVENOUS | Status: DC
  Administered 2011-11-29: 07:00:00 via INTRAVENOUS

## 2011-11-29 MED ORDER — MIDAZOLAM HCL 5 MG/5ML IJ SOLN
INTRAMUSCULAR | Status: DC | PRN
  Administered 2011-11-29: 2 mg via INTRAVENOUS

## 2011-11-29 MED ORDER — DEXAMETHASONE SODIUM PHOSPHATE 4 MG/ML IJ SOLN
INTRAMUSCULAR | Status: DC | PRN
  Administered 2011-11-29: 8 mg via INTRAVENOUS

## 2011-11-29 MED ORDER — SILVER NITRATE-POT NITRATE 75-25 % EX MISC
CUTANEOUS | Status: DC | PRN
  Administered 2011-11-29: 1

## 2011-11-29 MED ORDER — DEXTROSE 5 % IV SOLN: 1.0000 g | Freq: Once | INTRAVENOUS | Status: DC

## 2011-11-29 MED ORDER — FENTANYL CITRATE 0.05 MG/ML IJ SOLN: 25.0000 ug | INTRAMUSCULAR | Status: DC | PRN

## 2011-11-29 MED ORDER — SODIUM CHLORIDE 0.9 % IR SOLN
Status: DC | PRN
  Administered 2011-11-29: 3000 mL

## 2011-11-29 MED ORDER — FENTANYL CITRATE 0.05 MG/ML IJ SOLN
INTRAMUSCULAR | Status: DC | PRN
  Administered 2011-11-29: 100 ug via INTRAVENOUS

## 2011-11-29 MED ORDER — CEFOXITIN SODIUM 1 G IV SOLR
1.0000 g | INTRAVENOUS | Status: DC | PRN
  Administered 2011-11-29: 1 g via INTRAVENOUS

## 2011-11-29 MED ORDER — LIDOCAINE HCL (CARDIAC) 20 MG/ML IV SOLN
INTRAVENOUS | Status: DC | PRN
  Administered 2011-11-29: 100 mg via INTRAVENOUS

## 2011-11-29 SURGICAL SUPPLY — 43 items
ABLATOR ENDOMETRIAL MYOSURE (ABLATOR) ×3 IMPLANT
BAG URINE DRAINAGE (UROLOGICAL SUPPLIES) IMPLANT
CANISTER SUCTION 2500CC (MISCELLANEOUS) ×3 IMPLANT
CATH FOLEY 2WAY SLVR  5CC 16FR (CATHETERS)
CATH FOLEY 2WAY SLVR 5CC 16FR (CATHETERS) IMPLANT
CATH ROBINSON RED A/P 16FR (CATHETERS) ×3 IMPLANT
CLOTH BEACON ORANGE TIMEOUT ST (SAFETY) ×3 IMPLANT
CORD ACTIVE DISPOSABLE (ELECTRODE) ×1
CORD ELECTRO ACTIVE DISP (ELECTRODE) ×2 IMPLANT
COVER TABLE BACK 60X90 (DRAPES) ×3 IMPLANT
DRAPE CAMERA CLOSED 9X96 (DRAPES) ×3 IMPLANT
DRAPE LG THREE QUARTER DISP (DRAPES) ×3 IMPLANT
DRAPE UNDERBUTTOCKS STRL (DRAPE) IMPLANT
DRESSING TELFA 8X3 (GAUZE/BANDAGES/DRESSINGS) ×3 IMPLANT
ELECT LOOP GYNE PRO 24FR (CUTTING LOOP) ×3
ELECT REM PT RETURN 9FT ADLT (ELECTROSURGICAL) ×3
ELECT VAPORTRODE GRVD BAR (ELECTRODE) IMPLANT
ELECTRODE LOOP GYNE PRO 24FR (CUTTING LOOP) ×2 IMPLANT
ELECTRODE REM PT RTRN 9FT ADLT (ELECTROSURGICAL) ×2 IMPLANT
GLOVE BIO SURGEON STRL SZ 6.5 (GLOVE) ×3 IMPLANT
GLOVE ECLIPSE 6.0 STRL STRAW (GLOVE) ×3 IMPLANT
GLOVE ECLIPSE 7.5 STRL STRAW (GLOVE) ×6 IMPLANT
GLOVE INDICATOR 8.0 STRL GRN (GLOVE) ×3 IMPLANT
GOWN W/COTTON TOWEL STD LRG (GOWNS) ×3 IMPLANT
GOWN XL W/COTTON TOWEL STD (GOWNS) ×3 IMPLANT
KIT BERKELEY 1ST TRIMESTER 3/8 (MISCELLANEOUS) IMPLANT
LEGGING LITHOTOMY PAIR STRL (DRAPES) ×3 IMPLANT
PACK BASIN DAY SURGERY FS (CUSTOM PROCEDURE TRAY) ×3 IMPLANT
PAD OB MATERNITY 4.3X12.25 (PERSONAL CARE ITEMS) ×3 IMPLANT
PAD PREP 24X48 CUFFED NSTRL (MISCELLANEOUS) ×3 IMPLANT
SCOPETTES 8  STERILE (MISCELLANEOUS)
SCOPETTES 8 STERILE (MISCELLANEOUS) IMPLANT
SET BERKELEY SUCTION TUBING (SUCTIONS) IMPLANT
SET IRRIG Y TYPE TUR BLADDER L (SET/KITS/TRAYS/PACK) ×3 IMPLANT
TOP DISP BERKELEY (MISCELLANEOUS) IMPLANT
TOWEL OR 17X24 6PK STRL BLUE (TOWEL DISPOSABLE) ×6 IMPLANT
TRAY DSU PREP LF (CUSTOM PROCEDURE TRAY) ×3 IMPLANT
TUBING HYDROFLEX HYSTEROSCOPY (TUBING) ×3 IMPLANT
VACURETTE 10 RIGID CVD (CANNULA) IMPLANT
VACURETTE 7MM CVD STRL WRAP (CANNULA) IMPLANT
VACURETTE 8 RIGID CVD (CANNULA) IMPLANT
VACURETTE 9 RIGID CVD (CANNULA) IMPLANT
WATER STERILE IRR 500ML POUR (IV SOLUTION) ×3 IMPLANT

## 2011-11-29 NOTE — Op Note (Signed)
11/29/2011  8:41 AM  PATIENT:  Kristen Dunlap  50 y.o. female  PRE-OPERATIVE DIAGNOSIS:  endometrial polyp  POST-OPERATIVE DIAGNOSIS:  endometrial polyp  PROCEDURE:  Procedure(s): HYSTEROSCOPY  SURGEON:  Surgeon(s): Ok Edwards, MD  ANESTHESIA:   general  FINDINGS: 2 x 5 cm endometrial polyp attached to the anterior fundus of the uterus both tubal ostia identified smooth normal cervical canal the rest of endometrium otherwise unremarkable.  DESCRIPTION OF OPERATION: The patient was taken to the operating room where she underwent successful general endotracheal anesthesia patient had PAS stockings for DVT prophylaxis and had received a gram of Cefotan IV for infection prophylaxis as well. She was placed in the high lithotomy position a short weighted billed speculum was placed in the posterior vaginal vault a red Roxan Hockey had been inserted to evacuate the bladder is content for approximately 50 cc. Of note examination under anesthesia demonstrated anteverted uterus normal size no palpable adnexal masses. A single-tooth tenaculum was then placed in the anterior cervical lip after the vagina and perineum had been prepped with Betadine solution. The uterus sounded to 7 cm. Pratt dilators were utilized to size 17. The operative hysteroscope with the Myosure morcellator attachment was introduced into the intrauterine cavity. 0.9% sodium chloride was utilized as the distending media. A systematic inspection of the uterine cavity demonstrated a tortuous elongated 2 x 5 cm endometrial polyp with its attachment to the anterior uterine fundus. The entire polyp was morcellated and specimen submitted for histological evaluation. Pre-and post resectoscopic polypectomy pictures were obtained and will be kept as a copy of the patient's record in a copy in the hospital records as well. Patient tolerated procedure well was extubated and transferred to recovery stable vital signs. Fluid deficit was less than  100 cc. Blood loss was minimal. Patient received 30 mg of Toradol in route to the recovery room.  ESTIMATED BLOOD LOSS: * No blood loss amount entered *   Intake/Output Summary (Last 24 hours) at 11/29/11 0841 Last data filed at 11/29/11 4010  Gross per 24 hour  Intake    650 ml  Output      0 ml  Net    650 ml     BLOOD ADMINISTERED:none   LOCAL MEDICATIONS USED:  NONE  SPECIMEN:  Source of Specimen:  Endometrial polyp  DISPOSITION OF SPECIMEN:  PATHOLOGY  COUNTS:  YES  PLAN OF CARE: Transfer to PACU  [facility] HMD8:41 AMTD@

## 2011-11-29 NOTE — Anesthesia Preprocedure Evaluation (Signed)
Anesthesia Evaluation  Patient identified by MRN, date of birth, ID band Patient awake    Reviewed: Allergy & Precautions, H&P , NPO status , Patient's Chart, lab work & pertinent test results  Airway Mallampati: II TM Distance: >3 FB Neck ROM: Full    Dental No notable dental hx.    Pulmonary neg pulmonary ROS,  clear to auscultation  Pulmonary exam normal       Cardiovascular neg cardio ROS Regular Normal    Neuro/Psych Negative Neurological ROS  Negative Psych ROS   GI/Hepatic negative GI ROS, Neg liver ROS,   Endo/Other  Negative Endocrine ROS  Renal/GU negative Renal ROS  Genitourinary negative   Musculoskeletal negative musculoskeletal ROS (+)   Abdominal   Peds negative pediatric ROS (+)  Hematology negative hematology ROS (+)   Anesthesia Other Findings   Reproductive/Obstetrics negative OB ROS                           Anesthesia Physical Anesthesia Plan  ASA: I  Anesthesia Plan: General   Post-op Pain Management:    Induction: Intravenous  Airway Management Planned: LMA  Additional Equipment:   Intra-op Plan:   Post-operative Plan:   Informed Consent: I have reviewed the patients History and Physical, chart, labs and discussed the procedure including the risks, benefits and alternatives for the proposed anesthesia with the patient or authorized representative who has indicated his/her understanding and acceptance.   Dental advisory given  Plan Discussed with: CRNA  Anesthesia Plan Comments:         Anesthesia Quick Evaluation

## 2011-11-29 NOTE — Anesthesia Procedure Notes (Signed)
Procedure Name: LMA Insertion Date/Time: 11/29/2011 7:42 AM Performed by: Huel Coventry Pre-anesthesia Checklist: Patient identified, Emergency Drugs available, Suction available and Patient being monitored Patient Re-evaluated:Patient Re-evaluated prior to inductionOxygen Delivery Method: Circle System Utilized Preoxygenation: Pre-oxygenation with 100% oxygen Intubation Type: IV induction Ventilation: Mask ventilation without difficulty LMA: LMA inserted LMA Size: 4.0 Number of attempts: 1 Airway Equipment and Method: bite block Placement Confirmation: positive ETCO2 Tube secured with: Tape Dental Injury: Teeth and Oropharynx as per pre-operative assessment

## 2011-11-29 NOTE — H&P (View-Only) (Signed)
Kristen Dunlap is an 50 y.o. female. Who presented to the office today for her preop consultation. Patient was seen the office on November 2 for sonohysterogram as part of her evaluation for dysfunctional uterine bleeding. Patient had previously had an endometrial biopsy which demonstrated a benign endometrium and she had a hemoglobin 11.6. Her postmenopausal bleeding she had been kept on Megace 40 mg twice a day. Her sonohysterogram demonstrated a large 5.2 x 1.7 x 1.8 cm endometrial polyp both ovaries appeared to be normal by ultrasound. Uterus otherwise unremarkable. Patient scheduled to undergo resectoscopic polypectomy tomorrow at [facility] surgical center.  Pertinent Gynecological History: Menses: post-menopausal Bleeding: post menopausal bleeding Contraception: post menopausal status DES exposure: denies Blood transfusions: none Sexually transmitted diseases: no past history Previous GYN Procedures: 2 prior cesarean sections  Last mammogram: normal Date: 2012 Last pap: normal Date: 2012 OB History: G 2, P2  Menstrual History: Menarche age: 40 No LMP recorded. Patient is not currently having periods (Reason: Other).    Past Medical History  Diagnosis Date  . Personal history of kidney stones   . Heel spur     right  . Overweight   . Postmenopausal   . Postmenopausal bleeding 10/01/2011    Past Surgical History  Procedure Date  . Heel spur surgery 1998  . Cesarean section 1989 and 1991    X 2  . Tubal ligation 15 YRS AGO  . Tonsillectomy AS CHILD    Family History  Problem Relation Age of Onset  . Diabetes Father   . Cancer Father     throat ca/ smoker  . Cancer Paternal Grandmother     esophagus, cancer    Social History:  reports that she has never smoked. She has never used smokeless tobacco. She reports that she drinks alcohol. She reports that she does not use illicit drugs.  Allergies: No Known Allergies   (Not in a hospital admission)  @ROS @  Blood  pressure 126/88.  @PHYSEXAMBYAGE2 @  No results found for this or any previous visit (from the past 24 hour(s)).  No results found.  Assessment/Plan: Patient with postmenopausal bleeding recently had a benign endometrial biopsy. Sonohysterogram demonstrated a 5.2 x 1.7 x 1.8 cm endometrial polyp. Patient scheduled to undergo resectoscopic polypectomy and [facility] surgical center on November 30. Risk benefits and pros and cons of the operation were discussed with the patient to include infection although she will receive prophylaxis antibiotic also the risk of deep venous thrombosis and subsequent pulmonary embolism for this reason she will have PSA stockings. Patient fully aware this also the risk of perforation turned resectoscopic surgery as well as fluid extravasation and pulmonary edema requiring medical management in the event of hemorrhage and she would need blood or blood products she is fully aware of the potential risk of receiving blood from donor to include anaphylactic reaction hepatitis and AIDS. Patient's had a previous tubal sterilization procedure. We'll cauterize the base with a polyp was attached to enhance hemostasis.. Literature information was provided all questions are answered and we'll follow accordingly.  Ok Edwards 11/28/2011, 5:13 PM

## 2011-11-29 NOTE — Anesthesia Postprocedure Evaluation (Signed)
  Anesthesia Post-op Note  Patient: Kristen Dunlap  Procedure(s) Performed:  HYSTEROSCOPY - with Myosure  Patient Location: PACU  Anesthesia Type: General  Level of Consciousness: awake and alert   Airway and Oxygen Therapy: Patient Spontanous Breathing  Post-op Pain: mild  Post-op Assessment: Post-op Vital signs reviewed, Patient's Cardiovascular Status Stable, Respiratory Function Stable, Patent Airway and No signs of Nausea or vomiting  Post-op Vital Signs: stable  Complications: No apparent anesthesia complications

## 2011-11-29 NOTE — Transfer of Care (Signed)
Immediate Anesthesia Transfer of Care Note  Patient: Kristen Dunlap  Procedure(s) Performed:  HYSTEROSCOPY - with Myosure  Patient Location: PACU  Anesthesia Type: General  Level of Consciousness: awake, alert  and oriented  Airway & Oxygen Therapy: Patient Spontanous Breathing and Patient connected to face mask oxygen  Post-op Assessment: Report given to PACU RN and Post -op Vital signs reviewed and stable  Post vital signs: Reviewed and stable  Complications: No apparent anesthesia complications

## 2011-11-29 NOTE — Interval H&P Note (Signed)
History and Physical Interval Note:  11/29/2011 6:27 AM  Kristen Dunlap  has presented today for surgery, with the diagnosis of endometrial polyp  The various methods of treatment have been discussed with the patient and family. After consideration of risks, benefits and other options for treatment, the patient has consented to  Procedure(s): HYSTEROSCOPY WITH RESECTOSCOPE as a surgical intervention .  The patients' history has been reviewed, patient examined, no change in status, stable for surgery.  I have reviewed the patients' chart and labs.  Questions were answered to the patient's satisfaction.     Ok Edwards

## 2011-12-02 NOTE — Addendum Note (Signed)
Addendum  created 12/02/11 1219 by Lorrin Jackson   Modules edited:Anesthesia Responsible Staff

## 2011-12-02 NOTE — Addendum Note (Signed)
Addendum  created 12/02/11 1219 by Jaylon Grode   Modules edited:Anesthesia Responsible Staff    

## 2011-12-06 ENCOUNTER — Encounter (HOSPITAL_BASED_OUTPATIENT_CLINIC_OR_DEPARTMENT_OTHER): Payer: Self-pay | Admitting: Gynecology

## 2011-12-13 ENCOUNTER — Ambulatory Visit (INDEPENDENT_AMBULATORY_CARE_PROVIDER_SITE_OTHER): Payer: 59 | Admitting: Gynecology

## 2011-12-13 ENCOUNTER — Encounter: Payer: Self-pay | Admitting: Gynecology

## 2011-12-13 VITALS — BP 122/80

## 2011-12-13 DIAGNOSIS — Z9889 Other specified postprocedural states: Secondary | ICD-10-CM

## 2011-12-13 NOTE — Progress Notes (Signed)
Patient is a 50 year old who was referred to our practice as a courtesy of Dr. Joaquin Courts as a result of patient's postmenopausal bleeding. As part of her evaluation patient had undergone a sonohysterogram on November 2 and a large endometrial polyp measuring 5.2 x 1.7 x 1.8 cm was noted. Her uterus and ovaries otherwise were normal. She had a benign endometrial biopsy preop.  On the morning of November 30 patient underwent a resectoscopic polypectomy with the use of the Myosure hysteroscopic morcellator to remove the large endometrial polyp contributing to patient's postmenopausal bleeding. Patient did well intraoperatively and went home the same day and presented to the office today for her 2 weeks postop visit. The following is the final pathology report:   REPORT OF SURGICAL PATHOLOGY FINAL DIAGNOSIS Diagnosis Endometrial polyp - BENIGN ENDOMETRIAL POLYP. - NO ATYPIA, HYPERPLASIA OR MALIGNANCY. Abigail Miyamoto MD Pathologist, Electronic Signature  Exam: Abdomen: Soft nontender no rebound or guarding Pelvic: Bartholin urethra Skene was within normal limits Vagina: No gross lesions on inspection Cervix: No gross lesions on inspection Uterus: Anteverted normal size shape and consistency Adnexa: No palpable masses or tenderness  Assessment/plan: Patient status post resectoscopic polypectomy for postmenopausal bleeding with benign pathology. She will followup with her primary physician Dr. Joaquin Courts and we'll send her copy of this office note and appreciate the referral.

## 2011-12-13 NOTE — Patient Instructions (Signed)
Remember to follow up with Dr. Rogelia Rohrer for your annual exam. Remember to schedule your mammogram. Happy Holidays!

## 2012-04-16 ENCOUNTER — Encounter: Payer: Self-pay | Admitting: Family Medicine

## 2012-07-30 LAB — HM MAMMOGRAPHY: HM Mammogram: NORMAL

## 2013-02-18 ENCOUNTER — Telehealth: Payer: Self-pay | Admitting: Family Medicine

## 2013-02-18 DIAGNOSIS — Z0000 Encounter for general adult medical examination without abnormal findings: Secondary | ICD-10-CM

## 2013-02-18 NOTE — Telephone Encounter (Signed)
LAB ORDER FOR CPE LABS WEEK OF 03-01-2013

## 2013-02-22 ENCOUNTER — Other Ambulatory Visit: Payer: Self-pay | Admitting: Family Medicine

## 2013-03-04 LAB — CBC
MCH: 29 pg (ref 26.0–34.0)
MCHC: 32.9 g/dL (ref 30.0–36.0)
Platelets: 316 10*3/uL (ref 150–400)
RBC: 4.42 MIL/uL (ref 3.87–5.11)
RDW: 13.4 % (ref 11.5–15.5)

## 2013-03-04 LAB — HEPATIC FUNCTION PANEL
Albumin: 4.5 g/dL (ref 3.5–5.2)
Indirect Bilirubin: 0.4 mg/dL (ref 0.0–0.9)
Total Protein: 6.6 g/dL (ref 6.0–8.3)

## 2013-03-04 LAB — BASIC METABOLIC PANEL
Chloride: 108 mEq/L (ref 96–112)
Potassium: 4.5 mEq/L (ref 3.5–5.3)
Sodium: 142 mEq/L (ref 135–145)

## 2013-03-04 LAB — LIPID PANEL
Cholesterol: 205 mg/dL — ABNORMAL HIGH (ref 0–200)
Total CHOL/HDL Ratio: 2.8 Ratio
VLDL: 12 mg/dL (ref 0–40)

## 2013-03-04 LAB — TSH: TSH: 3.526 u[IU]/mL (ref 0.350–4.500)

## 2013-03-09 ENCOUNTER — Encounter: Payer: Self-pay | Admitting: Family Medicine

## 2013-03-09 ENCOUNTER — Ambulatory Visit (INDEPENDENT_AMBULATORY_CARE_PROVIDER_SITE_OTHER): Payer: 59 | Admitting: Family Medicine

## 2013-03-09 ENCOUNTER — Other Ambulatory Visit (HOSPITAL_COMMUNITY)
Admission: RE | Admit: 2013-03-09 | Discharge: 2013-03-09 | Disposition: A | Discharge status: Home / Self Care | Payer: 59 | Source: Ambulatory Visit | Attending: Family Medicine | Admitting: Family Medicine

## 2013-03-09 VITALS — BP 114/84 | HR 67 | Temp 98.1°F | Ht 66.0 in | Wt 185.0 lb

## 2013-03-09 DIAGNOSIS — Z8619 Personal history of other infectious and parasitic diseases: Secondary | ICD-10-CM | POA: Insufficient documentation

## 2013-03-09 DIAGNOSIS — Z780 Asymptomatic menopausal state: Secondary | ICD-10-CM

## 2013-03-09 DIAGNOSIS — E663 Overweight: Secondary | ICD-10-CM

## 2013-03-09 DIAGNOSIS — B009 Herpesviral infection, unspecified: Secondary | ICD-10-CM

## 2013-03-09 DIAGNOSIS — Z124 Encounter for screening for malignant neoplasm of cervix: Secondary | ICD-10-CM

## 2013-03-09 DIAGNOSIS — B001 Herpesviral vesicular dermatitis: Secondary | ICD-10-CM

## 2013-03-09 DIAGNOSIS — N950 Postmenopausal bleeding: Secondary | ICD-10-CM

## 2013-03-09 DIAGNOSIS — E785 Hyperlipidemia, unspecified: Secondary | ICD-10-CM

## 2013-03-09 DIAGNOSIS — Z0000 Encounter for general adult medical examination without abnormal findings: Secondary | ICD-10-CM

## 2013-03-09 DIAGNOSIS — Z01419 Encounter for gynecological examination (general) (routine) without abnormal findings: Secondary | ICD-10-CM | POA: Insufficient documentation

## 2013-03-09 HISTORY — DX: Personal history of other infectious and parasitic diseases: Z86.19

## 2013-03-09 HISTORY — DX: Herpesviral vesicular dermatitis: B00.1

## 2013-03-09 NOTE — Patient Instructions (Addendum)
Call insurance and check to see where they cover this procedure best Consider starting megaRed krill oil caps daily  Annual exam at next with labs prior lipids, renal, hepatic, tsh, cbc   Colonoscopy A colonoscopy is an exam to evaluate your entire colon. In this exam, your colon is cleansed. A long fiberoptic tube is inserted through your rectum and into your colon. The fiberoptic scope (endoscope) is a long bundle of enclosed and very flexible fibers. These fibers transmit light to the area examined and send images from that area to your caregiver. Discomfort is usually minimal. You may be given a drug to help you sleep (sedative) during or prior to the procedure. This exam helps to detect lumps (tumors), polyps, inflammation, and areas of bleeding. Your caregiver may also take a small piece of tissue (biopsy) that will be examined under a microscope. LET YOUR CAREGIVER KNOW ABOUT:   Allergies to food or medicine.  Medicines taken, including vitamins, herbs, eyedrops, over-the-counter medicines, and creams.  Use of steroids (by mouth or creams).  Previous problems with anesthetics or numbing medicines.  History of bleeding problems or blood clots.  Previous surgery.  Other health problems, including diabetes and kidney problems.  Possibility of pregnancy, if this applies. BEFORE THE PROCEDURE   A clear liquid diet may be required for 2 days before the exam.  Ask your caregiver about changing or stopping your regular medications.  Liquid injections (enemas) or laxatives may be required.  A large amount of electrolyte solution may be given to you to drink over a short period of time. This solution is used to clean out your colon.  You should be present 60 minutes prior to your procedure or as directed by your caregiver. AFTER THE PROCEDURE   If you received a sedative or pain relieving medication, you will need to arrange for someone to drive you home.  Occasionally, there is  a little blood passed with the first bowel movement. Do not be concerned. FINDING OUT THE RESULTS OF YOUR TEST Not all test results are available during your visit. If your test results are not back during the visit, make an appointment with your caregiver to find out the results. Do not assume everything is normal if you have not heard from your caregiver or the medical facility. It is important for you to follow up on all of your test results. HOME CARE INSTRUCTIONS   It is not unusual to pass moderate amounts of gas and experience mild abdominal cramping following the procedure. This is due to air being used to inflate your colon during the exam. Walking or a warm pack on your belly (abdomen) may help.  You may resume all normal meals and activities after sedatives and medicines have worn off.  Only take over-the-counter or prescription medicines for pain, discomfort, or fever as directed by your caregiver. Do not use aspirin or blood thinners if a biopsy was taken. Consult your caregiver for medicine usage if biopsies were taken. SEEK IMMEDIATE MEDICAL CARE IF:   You have a fever.  You pass large blood clots or fill a toilet with blood following the procedure. This may also occur 10 to 14 days following the procedure. This is more likely if a biopsy was taken.  You develop abdominal pain that keeps getting worse and cannot be relieved with medicine. Document Released: 12/13/2000 Document Revised: 03/09/2012 Document Reviewed: 07/28/2008 [facility] Patient Information 2013 [place], [place].

## 2013-03-14 ENCOUNTER — Encounter: Payer: Self-pay | Admitting: Family Medicine

## 2013-03-14 DIAGNOSIS — E785 Hyperlipidemia, unspecified: Secondary | ICD-10-CM

## 2013-03-14 HISTORY — DX: Hyperlipidemia, unspecified: E78.5

## 2013-03-14 NOTE — Assessment & Plan Note (Signed)
No further episodes s/p endometrial polypectomy

## 2013-03-14 NOTE — Assessment & Plan Note (Signed)
Has a long history of recurrent lesions. Given rx for Valtrex to use as needed

## 2013-03-14 NOTE — Assessment & Plan Note (Signed)
Avoid trans fats, minimize saturated fats and simple carbs. Increase exercise and start megaRed caps daily

## 2013-03-14 NOTE — Progress Notes (Signed)
Patient ID: Kristen Dunlap, female   DOB: Jun 09, 1961, 52 y.o.   MRN: [id number] Kristen Dunlap [id number] 1961/11/22 03/14/2013      Progress Note New Patient  Subjective  Chief Complaint  Chief Complaint  Patient presents with  . Annual Exam    physical  . Gynecologic Exam    pap    HPI  Patient is a 52 year old Caucasian female who is in today he'll exam. Overall she feels well although she does note she's had a recently of some fever blisters have calm down now but she did have take antivirals. She's going trips and is worried they will recur. She denies fevers or chills. No congestion or headache. No chest pain, palpitations, shortness of breath, GI or GU complaints. She is here today for Pap and denies any GYN concerns. She had an episode of post menopausal bleeding and she underwent surgery for Benign endometrial polyp which was removed. Since then she's had no further bleeding.  Past Medical History  Diagnosis Date  . Personal history of kidney stones   . Heel spur     right  . Overweight   . Postmenopausal   . Postmenopausal bleeding 10/01/2011  . Fever blister 03/09/2013  . Other and unspecified hyperlipidemia 03/14/2013    Past Surgical History  Procedure Laterality Date  . Heel spur surgery  1998  . Cesarean section  1989 and 1991    X 2  . Tubal ligation  15 YRS AGO  . Tonsillectomy  AS CHILD  . Hysteroscopy  11/29/2011    Procedure: HYSTEROSCOPY;  Surgeon: Ok Edwards, MD;  Location: [facility];  Service: Gynecology;  Laterality: N/A;  with Myosure    Family History  Problem Relation Age of Onset  . Diabetes Father   . Cancer Father     throat ca/ smoker  . Cancer Paternal Grandmother     esophagus, cancer    History   Social History  . Marital Status: Married    Spouse Name: N/A    Number of Children: N/A  . Years of Education: N/A   Occupational History  . Not on file.   Social History Main Topics  . Smoking status: Never  Smoker   . Smokeless tobacco: Never Used  . Alcohol Use: Yes     Comment: occasionaly  . Drug Use: No  . Sexually Active: Yes -- Female partner(s)     Comment: tubes tied   Other Topics Concern  . Not on file   Social History Narrative  . No narrative on file    Current Outpatient Prescriptions on File Prior to Visit  Medication Sig Dispense Refill  . Ascorbic Acid (VITAMIN C) 500 MG tablet Take 500 mg by mouth daily.       . calcium carbonate 200 MG capsule Take 250 mg by mouth daily.       . Ferrous Fumarate-Folic Acid (HEMOCYTE-F) 324-1 MG TABS Take 1 tablet by mouth daily.  30 each  1  . FIBER COMPLETE PO Take 2 tablets by mouth daily.       . fish oil-omega-3 fatty acids 1000 MG capsule Take 2 g by mouth daily.       Marland Kitchen glucosamine-chondroitin 500-400 MG tablet Take 1 tablet by mouth daily.       . Multiple Vitamin (MULTIVITAMIN) tablet Take 1 tablet by mouth daily.       . Nutritional Supplements (ESTROVEN PO) Take by mouth 1 day or 1  dose.       Marland Kitchen VITAMIN D, ERGOCALCIFEROL, PO Take 1 tablet by mouth daily.        No current facility-administered medications on file prior to visit.    No Known Allergies  Review of Systems  Review of Systems  Constitutional: Negative for fever and malaise/fatigue.  HENT: Negative for congestion.   Eyes: Negative for discharge.  Respiratory: Negative for shortness of breath.   Cardiovascular: Negative for chest pain, palpitations and leg swelling.  Gastrointestinal: Negative for nausea, abdominal pain and diarrhea.  Genitourinary: Negative for dysuria.  Musculoskeletal: Negative for falls.  Skin: Negative for rash.  Neurological: Negative for loss of consciousness and headaches.  Endo/Heme/Allergies: Negative for polydipsia.  Psychiatric/Behavioral: Negative for depression and suicidal ideas. The patient is not nervous/anxious and does not have insomnia.     Objective  BP 114/84  Pulse 67  Temp(Src) 98.1 F (36.7 C) (Oral)  Ht  5\' 6"  (1.676 m)  Wt 185 lb 0.6 oz (83.934 kg)  BMI 29.88 kg/m2  SpO2 96%  Physical Exam  Physical Exam  Constitutional: She is oriented to person, place, and time and well-developed, well-nourished, and in no distress. No distress.  HENT:  Head: Normocephalic and atraumatic.  Right Ear: External ear normal.  Left Ear: External ear normal.  Nose: Nose normal.  Mouth/Throat: Oropharynx is clear and moist. No oropharyngeal exudate.  Eyes: Conjunctivae are normal. Pupils are equal, round, and reactive to light. Right eye exhibits no discharge. Left eye exhibits no discharge. No scleral icterus.  Neck: Normal range of motion. Neck supple. No thyromegaly present.  Cardiovascular: Normal rate, regular rhythm, normal heart sounds and intact distal pulses.   No murmur heard. Pulmonary/Chest: Effort normal and breath sounds normal. No respiratory distress. She has no wheezes. She has no rales.  Abdominal: Soft. Bowel sounds are normal. She exhibits no distension and no mass. There is no tenderness.  Musculoskeletal: Normal range of motion. She exhibits no edema and no tenderness.  Lymphadenopathy:    She has no cervical adenopathy.  Neurological: She is alert and oriented to person, place, and time. She has normal reflexes. No cranial nerve deficit. Coordination normal.  Skin: Skin is warm and dry. No rash noted. She is not diaphoretic.  Psychiatric: Mood, memory and affect normal.       Assessment & Plan  Postmenopausal bleeding No further episodes s/p endometrial polypectomy  Postmenopausal No gyn c/o today. Exam unremarkable. Pap done today. Continue annual mgm and can proceed with every 2-3 year paps.  Fever blister Has a long history of recurrent lesions. Given rx for Valtrex to use as needed  Preventative health care Reviewed all labs with patient encouraged DASH diet and regular exercise, get 8 hours of sleep and wear seat belts routinely  Overweight Encouraged DASH diet  and regular exercise  Other and unspecified hyperlipidemia Avoid trans fats, minimize saturated fats and simple carbs. Increase exercise and start megaRed caps daily

## 2013-03-14 NOTE — Assessment & Plan Note (Signed)
Reviewed all labs with patient encouraged DASH diet and regular exercise, get 8 hours of sleep and wear seat belts routinely

## 2013-03-14 NOTE — Assessment & Plan Note (Signed)
No gyn c/o today. Exam unremarkable. Pap done today. Continue annual mgm and can proceed with every 2-3 year paps.

## 2013-03-14 NOTE — Assessment & Plan Note (Signed)
Encouraged DASH diet and regular exercise 

## 2013-04-13 IMAGING — US US TRANSVAGINAL NON-OB
1 series · 13 of 25 positions shown · non-contrast
Comparison: None.

CLINICAL DATA: Post menopausal bleeding.  Pelvic pain and cramping.

TRANSABDOMINAL AND TRANSVAGINAL ULTRASOUND OF PELVIS
TECHNIQUE: Both transabdominal and transvaginal ultrasound
examinations of the pelvis were performed.  Transabdominal
technique was performed for global imaging of the pelvis including
uterus, ovaries, adnexal regions, and pelvic cul-de-sac.
It was necessary to proceed with endovaginal exam following the
transabdominal exam to visualize the endometrial thickness, adnexa,
and ovaries.

[Series 1: us transvaginal non-ob · 0.30mm/px · 13 of 74 slices shown]
[im 1/74]
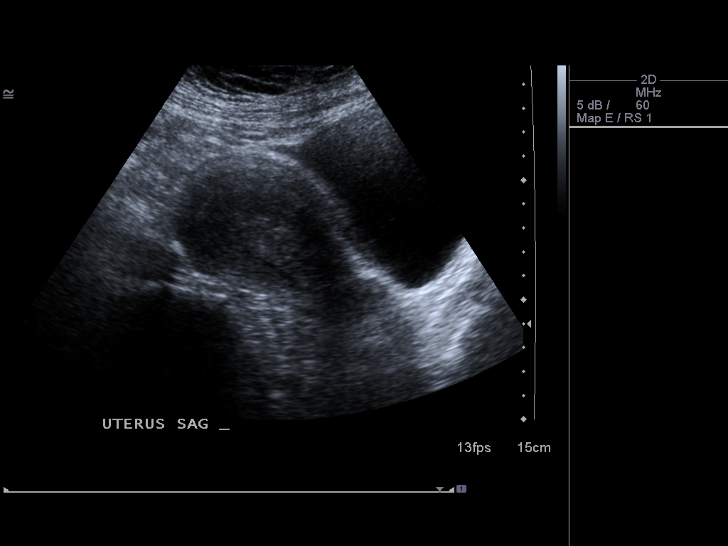
[im 7/74]
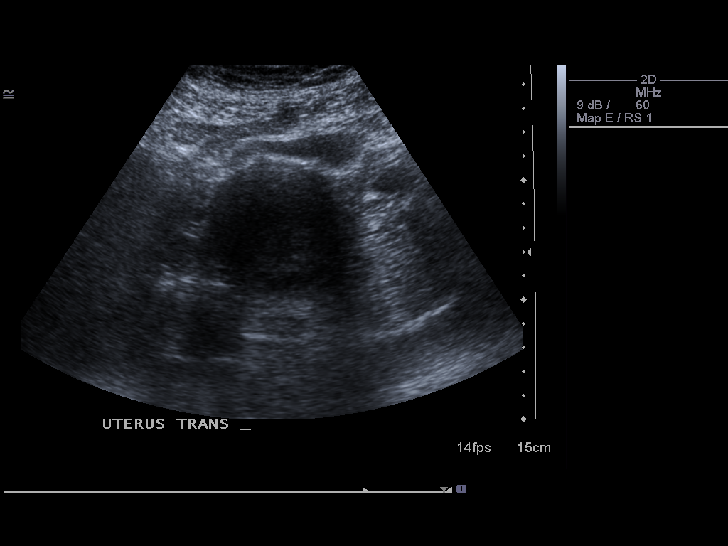
[im 13/74]
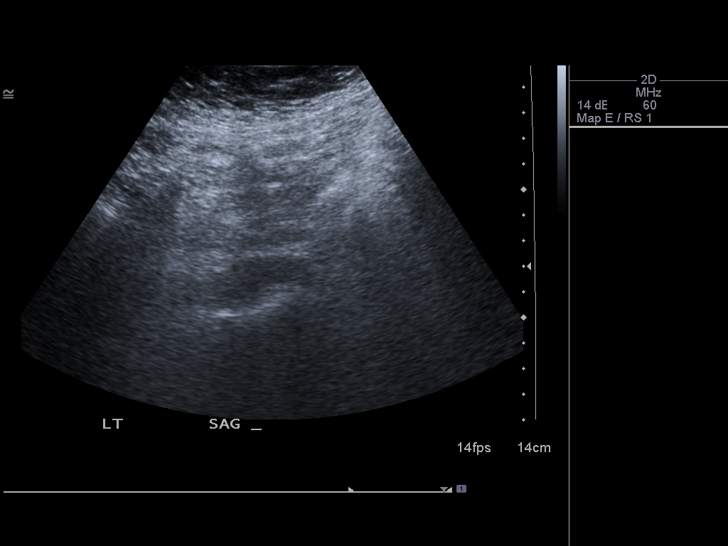
[im 19/74]
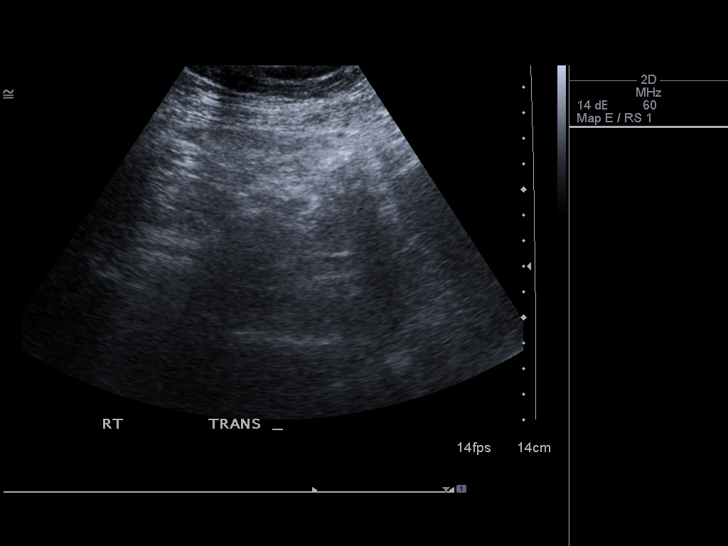
[im 25/74]
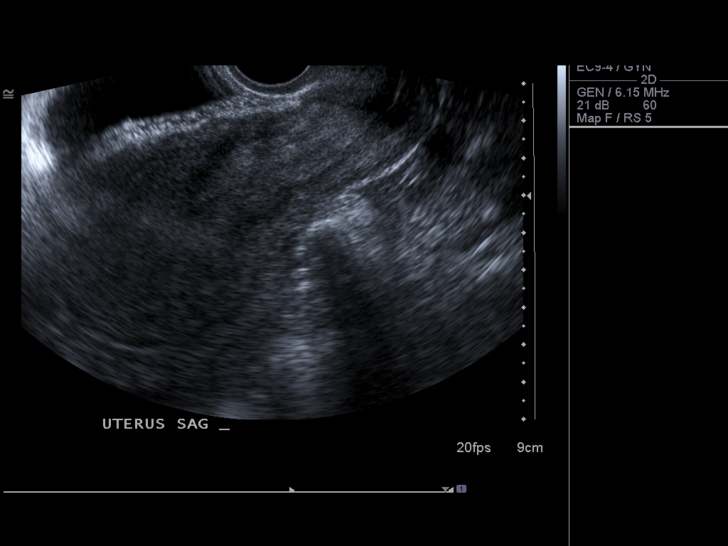
[im 31/74]
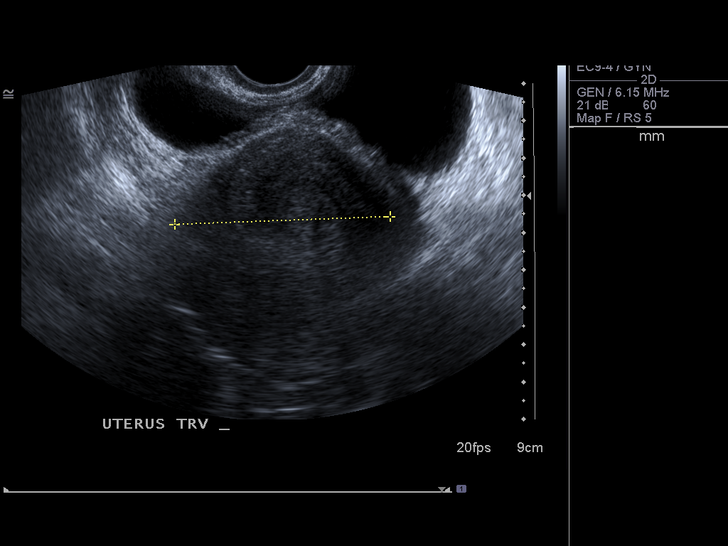
[im 37/74]
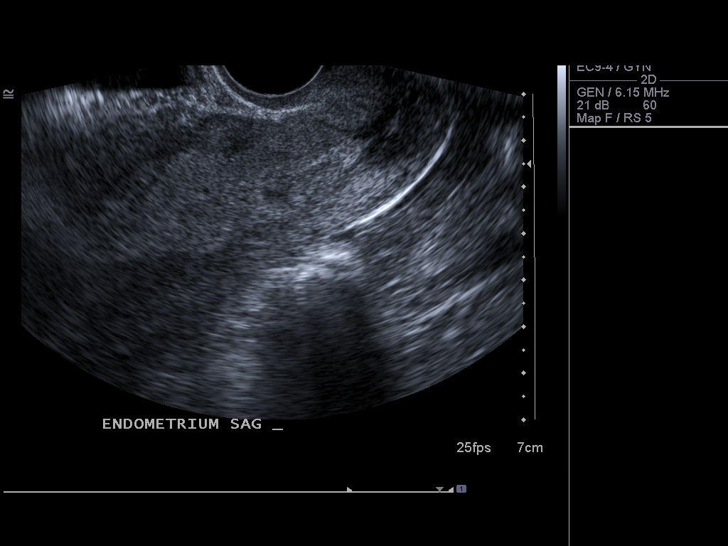
[im 43/74]
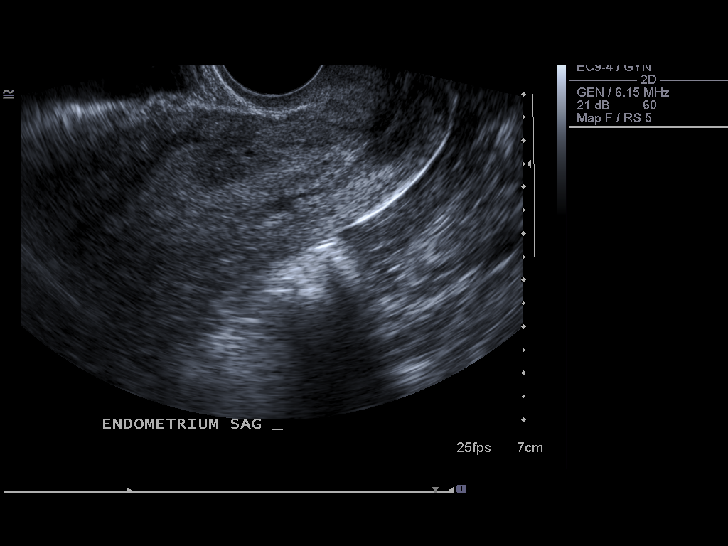
[im 49/74]
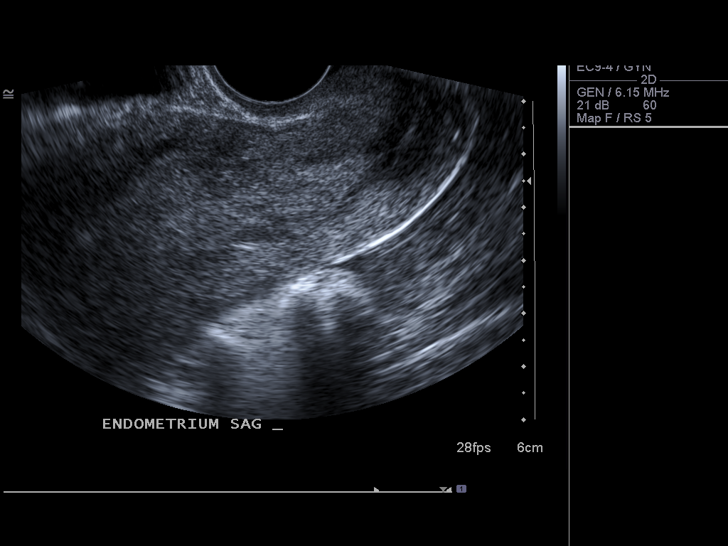
[im 55/74]
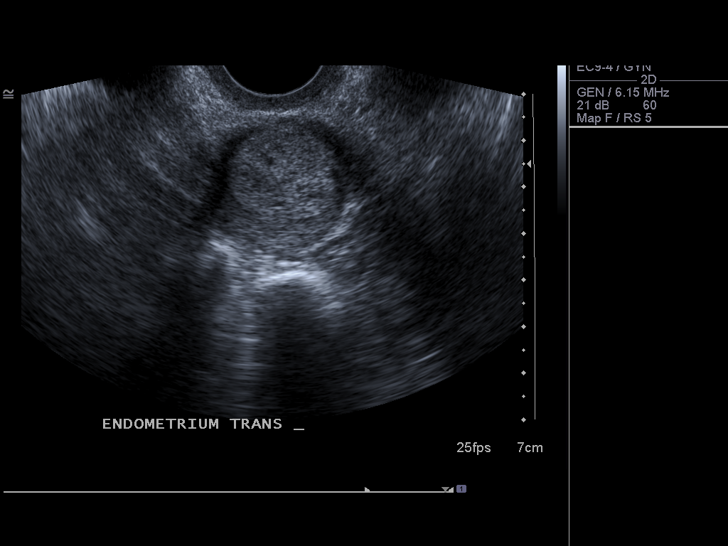
[im 61/74]
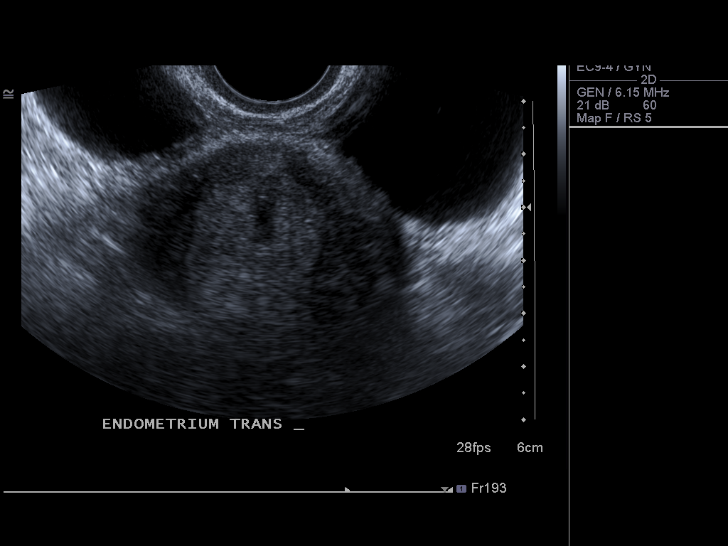
[im 67/74]
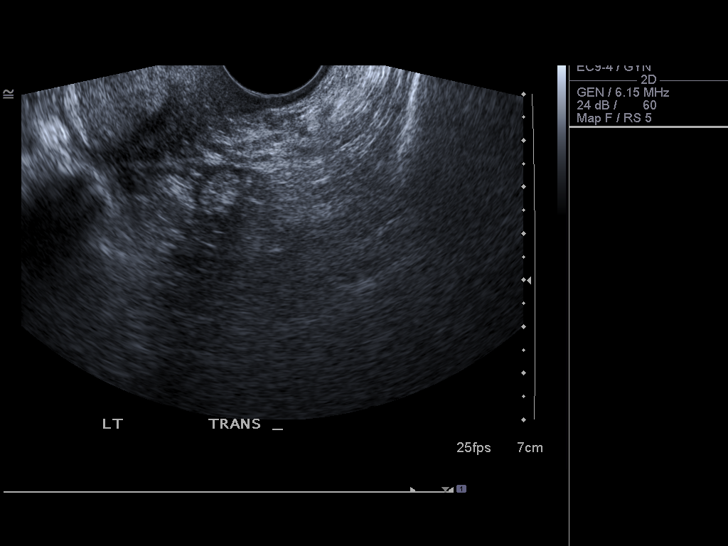
[im 74/74]
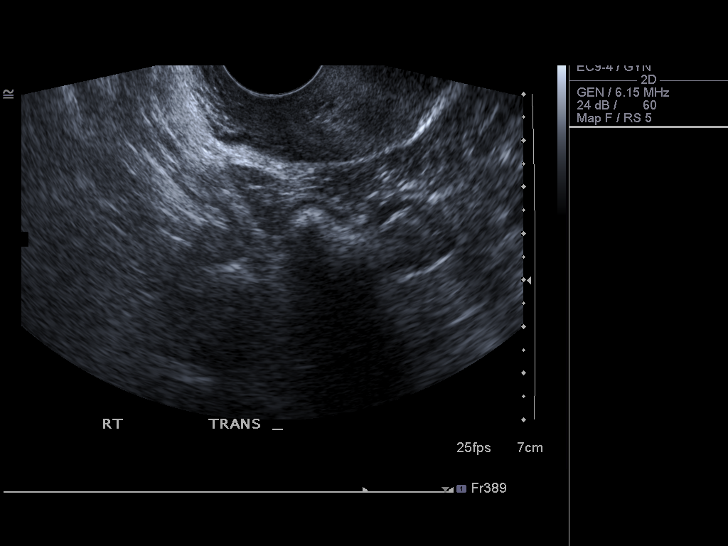

[13 of 25 positions shown; findings below may reference images not displayed]

FINDINGS: Uterus:  9.9 x 5.4 x 5.9 cm.  No fibroids are identified.

Endometrium: Diffuse endometrial thickening is seen with maximum
double layer thickness measuring 22 mm.  Thinning of the anterior
myometrium is seen in the lower uterine segment, and endometrial
carcinoma with myometrial invasion cannot be excluded.

Right ovary: not directly visualized by transabdominal or
transvaginal sonography, however no adnexal mass identified.

Left ovary: not directly visualized by transabdominal or
transvaginal sonography, however no adnexal mass identified.

Other Findings:  No free fluid
IMPRESSION: 1.  Diffuse endometrial thickening measuring up to 22 mm, with
thinning of the anterior myometrium in the lower uterine segment.
Endometrial carcinoma with myometrial invasion cannot be excluded.
Tissue diagnosis is recommended.  Pelvic MRI without and with
contrast could be obtained preoperatively for local staging.
2.  Nonvisualization of the ovaries, however no adnexal mass or
free fluid identified.

## 2013-12-07 ENCOUNTER — Encounter: Payer: Self-pay | Admitting: Family Medicine

## 2014-05-30 ENCOUNTER — Telehealth: Payer: Self-pay | Admitting: Family Medicine

## 2014-05-30 DIAGNOSIS — E785 Hyperlipidemia, unspecified: Secondary | ICD-10-CM

## 2014-05-30 DIAGNOSIS — Z0000 Encounter for general adult medical examination without abnormal findings: Secondary | ICD-10-CM

## 2014-05-30 NOTE — Telephone Encounter (Signed)
[facility] lab, patient will be going to [facility] lab

## 2014-10-20 ENCOUNTER — Other Ambulatory Visit (INDEPENDENT_AMBULATORY_CARE_PROVIDER_SITE_OTHER): Payer: 59

## 2014-10-20 DIAGNOSIS — E785 Hyperlipidemia, unspecified: Secondary | ICD-10-CM

## 2014-10-20 DIAGNOSIS — Z0000 Encounter for general adult medical examination without abnormal findings: Secondary | ICD-10-CM

## 2014-10-20 LAB — CBC
HCT: 40 % (ref 36.0–46.0)
HEMOGLOBIN: 12.9 g/dL (ref 12.0–15.0)
MCHC: 32.1 g/dL (ref 30.0–36.0)
MCV: 86.6 fl (ref 78.0–100.0)
PLATELETS: 281 10*3/uL (ref 150.0–400.0)
RBC: 4.62 Mil/uL (ref 3.87–5.11)
RDW: 14.3 % (ref 11.5–15.5)
WBC: 5.2 10*3/uL (ref 4.0–10.5)

## 2014-10-21 LAB — LIPID PANEL
CHOL/HDL RATIO: 3
Cholesterol: 198 mg/dL (ref 0–200)
HDL: 69.9 mg/dL (ref >39.00)
LDL CALC: 115 mg/dL — AB (ref 0–99)
NONHDL: 128.1
Triglycerides: 66 mg/dL (ref 0.0–149.0)
VLDL: 13.2 mg/dL (ref 0.0–40.0)

## 2014-10-21 LAB — RENAL FUNCTION PANEL
ALBUMIN: 3.8 g/dL (ref 3.5–5.2)
BUN: 19 mg/dL (ref 6–23)
CO2: 24 mEq/L (ref 19–32)
Calcium: 9.9 mg/dL (ref 8.4–10.5)
Chloride: 111 mEq/L (ref 96–112)
Creatinine, Ser: 0.7 mg/dL (ref 0.4–1.2)
GFR: 91.82 mL/min (ref >60.00)
GLUCOSE: 93 mg/dL (ref 70–99)
PHOSPHORUS: 2.2 mg/dL — AB (ref 2.3–4.6)
POTASSIUM: 4.3 meq/L (ref 3.5–5.1)
SODIUM: 140 meq/L (ref 135–145)

## 2014-10-21 LAB — HEPATIC FUNCTION PANEL
ALT: 11 U/L (ref 0–35)
AST: 12 U/L (ref 0–37)
Albumin: 3.8 g/dL (ref 3.5–5.2)
Alkaline Phosphatase: 85 U/L (ref 39–117)
BILIRUBIN DIRECT: 0.1 mg/dL (ref 0.0–0.3)
BILIRUBIN TOTAL: 0.9 mg/dL (ref 0.2–1.2)
TOTAL PROTEIN: 7.3 g/dL (ref 6.0–8.3)

## 2014-10-21 LAB — TSH: TSH: 3.1 u[IU]/mL (ref 0.35–4.50)

## 2014-10-25 ENCOUNTER — Encounter: Payer: Self-pay | Admitting: Family Medicine

## 2014-10-25 ENCOUNTER — Ambulatory Visit (INDEPENDENT_AMBULATORY_CARE_PROVIDER_SITE_OTHER): Payer: 59 | Admitting: Family Medicine

## 2014-10-25 VITALS — BP 113/76 | HR 66 | Temp 98.3°F | Ht 66.0 in | Wt 179.6 lb

## 2014-10-25 DIAGNOSIS — Z1211 Encounter for screening for malignant neoplasm of colon: Secondary | ICD-10-CM

## 2014-10-25 DIAGNOSIS — Z0000 Encounter for general adult medical examination without abnormal findings: Secondary | ICD-10-CM

## 2014-10-25 DIAGNOSIS — E663 Overweight: Secondary | ICD-10-CM

## 2014-10-25 MED ORDER — ESTROGENS, CONJUGATED 0.625 MG/GM VA CREA: 1.0000 | TOPICAL_CREAM | Freq: Every day | VAGINAL | Status: DC

## 2014-10-25 NOTE — Progress Notes (Signed)
Pre visit review using our clinic review tool, if applicable. No additional management support is needed unless otherwise documented below in the visit note. 

## 2014-10-25 NOTE — Patient Instructions (Addendum)
Krill oil caps daily (MegaRed by [employer] or NOW) NOW co order on [email or web address] Vitamin d 2000 IU daily Probiotic (Digestive Advantage by [employer] or NOW) Soy Flavenoids, Collagen Hyaluronic Acid for skin   Preventive Care for Adults A healthy lifestyle and preventive care can promote health and wellness. Preventive health guidelines for women include the following key practices.  A routine yearly physical is a good way to check with your health care provider about your health and preventive screening. It is a chance to share any concerns and updates on your health and to receive a thorough exam.  Visit your dentist for a routine exam and preventive care every 6 months. Brush your teeth twice a day and floss once a day. Good oral hygiene prevents tooth decay and gum disease.  The frequency of eye exams is based on your age, health, family medical history, use of contact lenses, and other factors. Follow your health care provider's recommendations for frequency of eye exams.  Eat a healthy diet. Foods like vegetables, fruits, whole grains, low-fat dairy products, and lean protein foods contain the nutrients you need without too many calories. Decrease your intake of foods high in solid fats, added sugars, and salt. Eat the right amount of calories for you.Get information about a proper diet from your health care provider, if necessary.  Regular physical exercise is one of the most important things you can do for your health. Most adults should get at least 150 minutes of moderate-intensity exercise (any activity that increases your heart rate and causes you to sweat) each week. In addition, most adults need muscle-strengthening exercises on 2 or more days a week.  Maintain a healthy weight. The body mass index (BMI) is a screening tool to identify possible weight problems. It provides an estimate of body fat based on height and weight. Your health care provider can find your BMI and can help you  achieve or maintain a healthy weight.For adults 20 years and older:  A BMI below 18.5 is considered underweight.  A BMI of 18.5 to 24.9 is normal.  A BMI of 25 to 29.9 is considered overweight.  A BMI of 30 and above is considered obese.  Maintain normal blood lipids and cholesterol levels by exercising and minimizing your intake of saturated fat. Eat a balanced diet with plenty of fruit and vegetables. Blood tests for lipids and cholesterol should begin at age 62 and be repeated every 5 years. If your lipid or cholesterol levels are high, you are over 50, or you are at high risk for heart disease, you may need your cholesterol levels checked more frequently.Ongoing high lipid and cholesterol levels should be treated with medicines if diet and exercise are not working.  If you smoke, find out from your health care provider how to quit. If you do not use tobacco, do not start.  Lung cancer screening is recommended for adults aged 22-80 years who are at high risk for developing lung cancer because of a history of smoking. A yearly low-dose CT scan of the lungs is recommended for people who have at least a 30-pack-year history of smoking and are a current smoker or have quit within the past 15 years. A pack year of smoking is smoking an average of 1 pack of cigarettes a day for 1 year (for example: 1 pack a day for 30 years or 2 packs a day for 15 years). Yearly screening should continue until the smoker has stopped smoking for at least 15  years. Yearly screening should be stopped for people who develop a health problem that would prevent them from having lung cancer treatment.  If you are pregnant, do not drink alcohol. If you are breastfeeding, be very cautious about drinking alcohol. If you are not pregnant and choose to drink alcohol, do not have more than 1 drink per day. One drink is considered to be 12 ounces (355 mL) of beer, 5 ounces (148 mL) of wine, or 1.5 ounces (44 mL) of liquor.  Avoid  use of street drugs. Do not share needles with anyone. Ask for help if you need support or instructions about stopping the use of drugs.  High blood pressure causes heart disease and increases the risk of stroke. Your blood pressure should be checked at least every 1 to 2 years. Ongoing high blood pressure should be treated with medicines if weight loss and exercise do not work.  If you are 47-25 years old, ask your health care provider if you should take aspirin to prevent strokes.  Diabetes screening involves taking a blood sample to check your fasting blood sugar level. This should be done once every 3 years, after age 77, if you are within normal weight and without risk factors for diabetes. Testing should be considered at a younger age or be carried out more frequently if you are overweight and have at least 1 risk factor for diabetes.  Breast cancer screening is essential preventive care for women. You should practice "breast self-awareness." This means understanding the normal appearance and feel of your breasts and may include breast self-examination. Any changes detected, no matter how small, should be reported to a health care provider. Women in their 59s and 30s should have a clinical breast exam (CBE) by a health care provider as part of a regular health exam every 1 to 3 years. After age 86, women should have a CBE every year. Starting at age 65, women should consider having a mammogram (breast X-ray test) every year. Women who have a family history of breast cancer should talk to their health care provider about genetic screening. Women at a high risk of breast cancer should talk to their health care providers about having an MRI and a mammogram every year.  Breast cancer gene (BRCA)-related cancer risk assessment is recommended for women who have family members with BRCA-related cancers. BRCA-related cancers include breast, ovarian, tubal, and peritoneal cancers. Having family members with  these cancers may be associated with an increased risk for harmful changes (mutations) in the breast cancer genes BRCA1 and BRCA2. Results of the assessment will determine the need for genetic counseling and BRCA1 and BRCA2 testing.  Routine pelvic exams to screen for cancer are no longer recommended for nonpregnant women who are considered low risk for cancer of the pelvic organs (ovaries, uterus, and vagina) and who do not have symptoms. Ask your health care provider if a screening pelvic exam is right for you.  If you have had past treatment for cervical cancer or a condition that could lead to cancer, you need Pap tests and screening for cancer for at least 20 years after your treatment. If Pap tests have been discontinued, your risk factors (such as having a new sexual partner) need to be reassessed to determine if screening should be resumed. Some women have medical problems that increase the chance of getting cervical cancer. In these cases, your health care provider may recommend more frequent screening and Pap tests.  The HPV test is an  additional test that may be used for cervical cancer screening. The HPV test looks for the virus that can cause the cell changes on the cervix. The cells collected during the Pap test can be tested for HPV. The HPV test could be used to screen women aged 34 years and older, and should be used in women of any age who have unclear Pap test results. After the age of 62, women should have HPV testing at the same frequency as a Pap test.  Colorectal cancer can be detected and often prevented. Most routine colorectal cancer screening begins at the age of 48 years and continues through age 66 years. However, your health care provider may recommend screening at an earlier age if you have risk factors for colon cancer. On a yearly basis, your health care provider may provide home test kits to check for hidden blood in the stool. Use of a small camera at the end of a tube, to  directly examine the colon (sigmoidoscopy or colonoscopy), can detect the earliest forms of colorectal cancer. Talk to your health care provider about this at age 58, when routine screening begins. Direct exam of the colon should be repeated every 5-10 years through age 96 years, unless early forms of pre-cancerous polyps or small growths are found.  People who are at an increased risk for hepatitis B should be screened for this virus. You are considered at high risk for hepatitis B if:  You were born in a country where hepatitis B occurs often. Talk with your health care provider about which countries are considered high risk.  Your parents were born in a high-risk country and you have not received a shot to protect against hepatitis B (hepatitis B vaccine).  You have HIV or AIDS.  You use needles to inject street drugs.  You live with, or have sex with, someone who has hepatitis B.  You get hemodialysis treatment.  You take certain medicines for conditions like cancer, organ transplantation, and autoimmune conditions.  Hepatitis C blood testing is recommended for all people born from [birth year] through 1965 and any individual with known risks for hepatitis C.  Practice safe sex. Use condoms and avoid high-risk sexual practices to reduce the spread of sexually transmitted infections (STIs). STIs include gonorrhea, chlamydia, syphilis, trichomonas, herpes, HPV, and human immunodeficiency virus (HIV). Herpes, HIV, and HPV are viral illnesses that have no cure. They can result in disability, cancer, and death.  You should be screened for sexually transmitted illnesses (STIs) including gonorrhea and chlamydia if:  You are sexually active and are younger than 24 years.  You are older than 24 years and your health care provider tells you that you are at risk for this type of infection.  Your sexual activity has changed since you were last screened and you are at an increased risk for chlamydia or  gonorrhea. Ask your health care provider if you are at risk.  If you are at risk of being infected with HIV, it is recommended that you take a prescription medicine daily to prevent HIV infection. This is called preexposure prophylaxis (PrEP). You are considered at risk if:  You are a heterosexual woman, are sexually active, and are at increased risk for HIV infection.  You take drugs by injection.  You are sexually active with a partner who has HIV.  Talk with your health care provider about whether you are at high risk of being infected with HIV. If you choose to begin PrEP, you  should first be tested for HIV. You should then be tested every 3 months for as long as you are taking PrEP.  Osteoporosis is a disease in which the bones lose minerals and strength with aging. This can result in serious bone fractures or breaks. The risk of osteoporosis can be identified using a bone density scan. Women ages 35 years and over and women at risk for fractures or osteoporosis should discuss screening with their health care providers. Ask your health care provider whether you should take a calcium supplement or vitamin D to reduce the rate of osteoporosis.  Menopause can be associated with physical symptoms and risks. Hormone replacement therapy is available to decrease symptoms and risks. You should talk to your health care provider about whether hormone replacement therapy is right for you.  Use sunscreen. Apply sunscreen liberally and repeatedly throughout the day. You should seek shade when your shadow is shorter than you. Protect yourself by wearing long sleeves, pants, a wide-brimmed hat, and sunglasses year round, whenever you are outdoors.  Once a month, do a whole body skin exam, using a mirror to look at the skin on your back. Tell your health care provider of new moles, moles that have irregular borders, moles that are larger than a pencil eraser, or moles that have changed in shape or  color.  Stay current with required vaccines (immunizations).  Influenza vaccine. All adults should be immunized every year.  Tetanus, diphtheria, and acellular pertussis (Td, Tdap) vaccine. Pregnant women should receive 1 dose of Tdap vaccine during each pregnancy. The dose should be obtained regardless of the length of time since the last dose. Immunization is preferred during the 27th-36th week of gestation. An adult who has not previously received Tdap or who does not know her vaccine status should receive 1 dose of Tdap. This initial dose should be followed by tetanus and diphtheria toxoids (Td) booster doses every 10 years. Adults with an unknown or incomplete history of completing a 3-dose immunization series with Td-containing vaccines should begin or complete a primary immunization series including a Tdap dose. Adults should receive a Td booster every 10 years.  Varicella vaccine. An adult without evidence of immunity to varicella should receive 2 doses or a second dose if she has previously received 1 dose. Pregnant females who do not have evidence of immunity should receive the first dose after pregnancy. This first dose should be obtained before leaving the health care facility. The second dose should be obtained 4-8 weeks after the first dose.  Human papillomavirus (HPV) vaccine. Females aged 13-26 years who have not received the vaccine previously should obtain the 3-dose series. The vaccine is not recommended for use in pregnant females. However, pregnancy testing is not needed before receiving a dose. If a female is found to be pregnant after receiving a dose, no treatment is needed. In that case, the remaining doses should be delayed until after the pregnancy. Immunization is recommended for any person with an immunocompromised condition through the age of 25 years if she did not get any or all doses earlier. During the 3-dose series, the second dose should be obtained 4-8 weeks after the  first dose. The third dose should be obtained 24 weeks after the first dose and 16 weeks after the second dose.  Zoster vaccine. One dose is recommended for adults aged 55 years or older unless certain conditions are present.  Measles, mumps, and rubella (MMR) vaccine. Adults born before [birth year] generally are considered immune  to measles and mumps. Adults born in [birth year] or later should have 1 or more doses of MMR vaccine unless there is a contraindication to the vaccine or there is laboratory evidence of immunity to each of the three diseases. A routine second dose of MMR vaccine should be obtained at least 28 days after the first dose for students attending postsecondary schools, health care workers, or international travelers. People who received inactivated measles vaccine or an unknown type of measles vaccine during 1963-1967 should receive 2 doses of MMR vaccine. People who received inactivated mumps vaccine or an unknown type of mumps vaccine before 1979 and are at high risk for mumps infection should consider immunization with 2 doses of MMR vaccine. For females of childbearing age, rubella immunity should be determined. If there is no evidence of immunity, females who are not pregnant should be vaccinated. If there is no evidence of immunity, females who are pregnant should delay immunization until after pregnancy. Unvaccinated health care workers born before [birth year] who lack laboratory evidence of measles, mumps, or rubella immunity or laboratory confirmation of disease should consider measles and mumps immunization with 2 doses of MMR vaccine or rubella immunization with 1 dose of MMR vaccine.  Pneumococcal 13-valent conjugate (PCV13) vaccine. When indicated, a person who is uncertain of her immunization history and has no record of immunization should receive the PCV13 vaccine. An adult aged 67 years or older who has certain medical conditions and has not been previously immunized should receive 1 dose of  PCV13 vaccine. This PCV13 should be followed with a dose of pneumococcal polysaccharide (PPSV23) vaccine. The PPSV23 vaccine dose should be obtained at least 8 weeks after the dose of PCV13 vaccine. An adult aged 98 years or older who has certain medical conditions and previously received 1 or more doses of PPSV23 vaccine should receive 1 dose of PCV13. The PCV13 vaccine dose should be obtained 1 or more years after the last PPSV23 vaccine dose.  Pneumococcal polysaccharide (PPSV23) vaccine. When PCV13 is also indicated, PCV13 should be obtained first. All adults aged 3 years and older should be immunized. An adult younger than age 44 years who has certain medical conditions should be immunized. Any person who resides in a nursing home or long-term care facility should be immunized. An adult smoker should be immunized. People with an immunocompromised condition and certain other conditions should receive both PCV13 and PPSV23 vaccines. People with human immunodeficiency virus (HIV) infection should be immunized as soon as possible after diagnosis. Immunization during chemotherapy or radiation therapy should be avoided. Routine use of PPSV23 vaccine is not recommended for American Indians, [place] Natives, or people younger than 65 years unless there are medical conditions that require PPSV23 vaccine. When indicated, people who have unknown immunization and have no record of immunization should receive PPSV23 vaccine. One-time revaccination 5 years after the first dose of PPSV23 is recommended for people aged 19-64 years who have chronic kidney failure, nephrotic syndrome, asplenia, or immunocompromised conditions. People who received 1-2 doses of PPSV23 before age 31 years should receive another dose of PPSV23 vaccine at age 21 years or later if at least 5 years have passed since the previous dose. Doses of PPSV23 are not needed for people immunized with PPSV23 at or after age 12 years.  Meningococcal vaccine.  Adults with asplenia or persistent complement component deficiencies should receive 2 doses of quadrivalent meningococcal conjugate (MenACWY-D) vaccine. The doses should be obtained at least 2 months apart. Microbiologists working with certain  meningococcal bacteria, [employer] recruits, people at risk during an outbreak, and people who travel to or live in countries with a high rate of meningitis should be immunized. A first-year college student up through age 35 years who is living in a residence hall should receive a dose if she did not receive a dose on or after her 16th birthday. Adults who have certain high-risk conditions should receive one or more doses of vaccine.  Hepatitis A vaccine. Adults who wish to be protected from this disease, have certain high-risk conditions, work with hepatitis A-infected animals, work in hepatitis A research labs, or travel to or work in countries with a high rate of hepatitis A should be immunized. Adults who were previously unvaccinated and who anticipate close contact with an international adoptee during the first 60 days after arrival in the [place] from a country with a high rate of hepatitis A should be immunized.  Hepatitis B vaccine. Adults who wish to be protected from this disease, have certain high-risk conditions, may be exposed to blood or other infectious body fluids, are household contacts or sex partners of hepatitis B positive people, are clients or workers in certain care facilities, or travel to or work in countries with a high rate of hepatitis B should be immunized.  Haemophilus influenzae type b (Hib) vaccine. A previously unvaccinated person with asplenia or sickle cell disease or having a scheduled splenectomy should receive 1 dose of Hib vaccine. Regardless of previous immunization, a recipient of a hematopoietic stem cell transplant should receive a 3-dose series 6-12 months after her successful transplant. Hib vaccine is not recommended for  adults with HIV infection. Preventive Services / Frequency Ages 1 to 62 years  Blood pressure check.** / Every 1 to 2 years.  Lipid and cholesterol check.** / Every 5 years beginning at age 31.  Clinical breast exam.** / Every 3 years for women in their 21s and 60s.  BRCA-related cancer risk assessment.** / For women who have family members with a BRCA-related cancer (breast, ovarian, tubal, or peritoneal cancers).  Pap test.** / Every 2 years from ages 18 through 35. Every 3 years starting at age 35 through age 25 or 58 with a history of 3 consecutive normal Pap tests.  HPV screening.** / Every 3 years from ages 108 through ages 48 to 78 with a history of 3 consecutive normal Pap tests.  Hepatitis C blood test.** / For any individual with known risks for hepatitis C.  Skin self-exam. / Monthly.  Influenza vaccine. / Every year.  Tetanus, diphtheria, and acellular pertussis (Tdap, Td) vaccine.** / Consult your health care provider. Pregnant women should receive 1 dose of Tdap vaccine during each pregnancy. 1 dose of Td every 10 years.  Varicella vaccine.** / Consult your health care provider. Pregnant females who do not have evidence of immunity should receive the first dose after pregnancy.  HPV vaccine. / 3 doses over 6 months, if 50 and younger. The vaccine is not recommended for use in pregnant females. However, pregnancy testing is not needed before receiving a dose.  Measles, mumps, rubella (MMR) vaccine.** / You need at least 1 dose of MMR if you were born in 1957 or later. You may also need a 2nd dose. For females of childbearing age, rubella immunity should be determined. If there is no evidence of immunity, females who are not pregnant should be vaccinated. If there is no evidence of immunity, females who are pregnant should delay immunization until after pregnancy.  Pneumococcal 13-valent conjugate (PCV13) vaccine.** / Consult your health care provider.  Pneumococcal  polysaccharide (PPSV23) vaccine.** / 1 to 2 doses if you smoke cigarettes or if you have certain conditions.  Meningococcal vaccine.** / 1 dose if you are age 28 to 78 years and a Market researcher living in a residence hall, or have one of several medical conditions, you need to get vaccinated against meningococcal disease. You may also need additional booster doses.  Hepatitis A vaccine.** / Consult your health care provider.  Hepatitis B vaccine.** / Consult your health care provider.  Haemophilus influenzae type b (Hib) vaccine.** / Consult your health care provider. Ages 81 to 102 years  Blood pressure check.** / Every 1 to 2 years.  Lipid and cholesterol check.** / Every 5 years beginning at age 76 years.  Lung cancer screening. / Every year if you are aged 55-80 years and have a 30-pack-year history of smoking and currently smoke or have quit within the past 15 years. Yearly screening is stopped once you have quit smoking for at least 15 years or develop a health problem that would prevent you from having lung cancer treatment.  Clinical breast exam.** / Every year after age 5 years.  BRCA-related cancer risk assessment.** / For women who have family members with a BRCA-related cancer (breast, ovarian, tubal, or peritoneal cancers).  Mammogram.** / Every year beginning at age 58 years and continuing for as long as you are in good health. Consult with your health care provider.  Pap test.** / Every 3 years starting at age 29 years through age 57 or 67 years with a history of 3 consecutive normal Pap tests.  HPV screening.** / Every 3 years from ages 27 years through ages 8 to 80 years with a history of 3 consecutive normal Pap tests.  Fecal occult blood test (FOBT) of stool. / Every year beginning at age 58 years and continuing until age 8 years. You may not need to do this test if you get a colonoscopy every 10 years.  Flexible sigmoidoscopy or colonoscopy.** / Every 5  years for a flexible sigmoidoscopy or every 10 years for a colonoscopy beginning at age 80 years and continuing until age 1 years.  Hepatitis C blood test.** / For all people born from [birth year] through 1964 and any individual with known risks for hepatitis C.  Skin self-exam. / Monthly.  Influenza vaccine. / Every year.  Tetanus, diphtheria, and acellular pertussis (Tdap/Td) vaccine.** / Consult your health care provider. Pregnant women should receive 1 dose of Tdap vaccine during each pregnancy. 1 dose of Td every 10 years.  Varicella vaccine.** / Consult your health care provider. Pregnant females who do not have evidence of immunity should receive the first dose after pregnancy.  Zoster vaccine.** / 1 dose for adults aged 61 years or older.  Measles, mumps, rubella (MMR) vaccine.** / You need at least 1 dose of MMR if you were born in 1957 or later. You may also need a 2nd dose. For females of childbearing age, rubella immunity should be determined. If there is no evidence of immunity, females who are not pregnant should be vaccinated. If there is no evidence of immunity, females who are pregnant should delay immunization until after pregnancy.  Pneumococcal 13-valent conjugate (PCV13) vaccine.** / Consult your health care provider.  Pneumococcal polysaccharide (PPSV23) vaccine.** / 1 to 2 doses if you smoke cigarettes or if you have certain conditions.  Meningococcal vaccine.** / Consult your health care provider.  Hepatitis A vaccine.** / Consult your health care provider.  Hepatitis B vaccine.** / Consult your health care provider.  Haemophilus influenzae type b (Hib) vaccine.** / Consult your health care provider. Ages 33 years and over  Blood pressure check.** / Every 1 to 2 years.  Lipid and cholesterol check.** / Every 5 years beginning at age 23 years.  Lung cancer screening. / Every year if you are aged 38-80 years and have a 30-pack-year history of smoking and currently  smoke or have quit within the past 15 years. Yearly screening is stopped once you have quit smoking for at least 15 years or develop a health problem that would prevent you from having lung cancer treatment.  Clinical breast exam.** / Every year after age 39 years.  BRCA-related cancer risk assessment.** / For women who have family members with a BRCA-related cancer (breast, ovarian, tubal, or peritoneal cancers).  Mammogram.** / Every year beginning at age 74 years and continuing for as long as you are in good health. Consult with your health care provider.  Pap test.** / Every 3 years starting at age 34 years through age 31 or 72 years with 3 consecutive normal Pap tests. Testing can be stopped between 65 and 70 years with 3 consecutive normal Pap tests and no abnormal Pap or HPV tests in the past 10 years.  HPV screening.** / Every 3 years from ages 3 years through ages 50 or 88 years with a history of 3 consecutive normal Pap tests. Testing can be stopped between 65 and 70 years with 3 consecutive normal Pap tests and no abnormal Pap or HPV tests in the past 10 years.  Fecal occult blood test (FOBT) of stool. / Every year beginning at age 36 years and continuing until age 40 years. You may not need to do this test if you get a colonoscopy every 10 years.  Flexible sigmoidoscopy or colonoscopy.** / Every 5 years for a flexible sigmoidoscopy or every 10 years for a colonoscopy beginning at age 57 years and continuing until age 49 years.  Hepatitis C blood test.** / For all people born from [birth year] through 1964 and any individual with known risks for hepatitis C.  Osteoporosis screening.** / A one-time screening for women ages 26 years and over and women at risk for fractures or osteoporosis.  Skin self-exam. / Monthly.  Influenza vaccine. / Every year.  Tetanus, diphtheria, and acellular pertussis (Tdap/Td) vaccine.** / 1 dose of Td every 10 years.  Varicella vaccine.** / Consult your  health care provider.  Zoster vaccine.** / 1 dose for adults aged 104 years or older.  Pneumococcal 13-valent conjugate (PCV13) vaccine.** / Consult your health care provider.  Pneumococcal polysaccharide (PPSV23) vaccine.** / 1 dose for all adults aged 19 years and older.  Meningococcal vaccine.** / Consult your health care provider.  Hepatitis A vaccine.** / Consult your health care provider.  Hepatitis B vaccine.** / Consult your health care provider.  Haemophilus influenzae type b (Hib) vaccine.** / Consult your health care provider. ** Family history and personal history of risk and conditions may change your health care provider's recommendations. Document Released: 02/11/2002 Document Revised: 05/02/2014 Document Reviewed: 05/13/2011 [facility] Patient Information 2015 [place], [place]. This information is not intended to replace advice given to you by your health care provider. Make sure you discuss any questions you have with your health care provider.

## 2014-10-30 NOTE — Assessment & Plan Note (Signed)
Agrees to referral to gastroenterology for screening colonoscopy. Patient encouraged to maintain heart healthy diet, regular exercise, adequate sleep. Consider daily probiotics. Take medications as prescribed

## 2014-10-30 NOTE — Assessment & Plan Note (Signed)
Encouraged heart healthy diet, increase exercise, avoid trans fats, consider a krill oil cap daily 

## 2014-10-30 NOTE — Progress Notes (Signed)
Kristen Dunlap [id number] 05-Jul-1961 10/30/2014      Progress Note-Follow Up  Subjective  Chief Complaint  Chief Complaint  Patient presents with  . Annual Exam    physical    HPI  Patient is a 53 year old female in today for routine medical care. In for annual exam doing well. No recent illness. No acute concerns. After discussion agrees to proceed with colonoscopy. Denies CP/palp/SOB/HA/congestion/fevers/GI or GU c/o. Taking meds as prescribed  Past Medical History  Diagnosis Date  . Personal history of kidney stones   . Heel spur     right  . Overweight(278.02)   . Postmenopausal   . Postmenopausal bleeding 10/01/2011  . Fever blister 03/09/2013  . Other and unspecified hyperlipidemia 03/14/2013    Past Surgical History  Procedure Laterality Date  . Heel spur surgery  1998  . Cesarean section  1989 and 1991    X 2  . Tubal ligation  15 YRS AGO  . Tonsillectomy  AS CHILD  . Hysteroscopy  11/29/2011    Procedure: HYSTEROSCOPY;  Surgeon: Terrance Mass, MD;  Location: [facility];  Service: Gynecology;  Laterality: N/A;  with Myosure    Family History  Problem Relation Age of Onset  . Diabetes Father   . Cancer Father     throat ca/ smoker  . Dementia Father   . Cancer Paternal Grandmother     esophagus, cancer  . Other Sister 88    PLS  . Early death Sister     History   Social History  . Marital Status: Married    Spouse Name: N/A    Number of Children: N/A  . Years of Education: N/A   Occupational History  . Not on file.   Social History Main Topics  . Smoking status: Never Smoker   . Smokeless tobacco: Never Used  . Alcohol Use: Yes     Comment: occasionaly  . Drug Use: No  . Sexual Activity:    Partners: Male     Comment: tubes tied, lives with husband, works in office setting, accounting, no dietary restrictions   Other Topics Concern  . Not on file   Social History Narrative  . No narrative on file    Current  Outpatient Prescriptions on File Prior to Visit  Medication Sig Dispense Refill  . Ascorbic Acid (VITAMIN C) 500 MG tablet Take 500 mg by mouth daily.     . calcium carbonate 200 MG capsule Take 250 mg by mouth daily.     . Ferrous Fumarate-Folic Acid (HEMOCYTE-F) 324-1 MG TABS Take 1 tablet by mouth daily. 30 each 1  . FIBER COMPLETE PO Take 2 tablets by mouth daily.     . fish oil-omega-3 fatty acids 1000 MG capsule Take 2 g by mouth daily.     Marland Kitchen glucosamine-chondroitin 500-400 MG tablet Take 1 tablet by mouth daily.     . Multiple Vitamin (MULTIVITAMIN) tablet Take 1 tablet by mouth daily.     . Nutritional Supplements (ESTROVEN PO) Take by mouth 1 day or 1 dose.     Marland Kitchen VITAMIN D, ERGOCALCIFEROL, PO Take 1 tablet by mouth daily.      No current facility-administered medications on file prior to visit.    No Known Allergies  Review of Systems  Review of Systems  Constitutional: Negative for fever, chills and malaise/fatigue.  HENT: Negative for congestion, hearing loss and nosebleeds.   Eyes: Negative for discharge.  Respiratory: Negative  for cough, sputum production, shortness of breath and wheezing.   Cardiovascular: Negative for chest pain, palpitations and leg swelling.  Gastrointestinal: Negative for heartburn, nausea, vomiting, abdominal pain, diarrhea, constipation and blood in stool.  Genitourinary: Negative for dysuria, urgency, frequency and hematuria.  Musculoskeletal: Negative for myalgias, back pain and falls.  Skin: Negative for rash.  Neurological: Negative for dizziness, tremors, sensory change, focal weakness, loss of consciousness, weakness and headaches.  Endo/Heme/Allergies: Negative for polydipsia. Does not bruise/bleed easily.  Psychiatric/Behavioral: Negative for depression and suicidal ideas. The patient is not nervous/anxious and does not have insomnia.     Objective  BP 113/76 mmHg  Pulse 66  Temp(Src) 98.3 F (36.8 C) (Oral)  Ht 5\' 6"  (1.676 m)  Wt  179 lb 9.6 oz (81.466 kg)  BMI 29.00 kg/m2  SpO2 98%  Physical Exam  Physical Exam  Constitutional: She is oriented to person, place, and time and well-developed, well-nourished, and in no distress. No distress.  HENT:  Head: Normocephalic and atraumatic.  Right Ear: External ear normal.  Left Ear: External ear normal.  Nose: Nose normal.  Mouth/Throat: Oropharynx is clear and moist. No oropharyngeal exudate.  Eyes: Conjunctivae are normal. Pupils are equal, round, and reactive to light. Right eye exhibits no discharge. Left eye exhibits no discharge. No scleral icterus.  Neck: Normal range of motion. Neck supple. No thyromegaly present.  Cardiovascular: Normal rate, regular rhythm and intact distal pulses.   Murmur heard. 1/6 systolic M  Pulmonary/Chest: Effort normal and breath sounds normal. No respiratory distress. She has no wheezes. She has no rales.  Abdominal: Soft. Bowel sounds are normal. She exhibits no distension and no mass. There is no tenderness.  Musculoskeletal: Normal range of motion. She exhibits no edema or tenderness.  Lymphadenopathy:    She has no cervical adenopathy.  Neurological: She is alert and oriented to person, place, and time. She has normal reflexes. No cranial nerve deficit. Coordination normal.  Skin: Skin is warm and dry. No rash noted. She is not diaphoretic.  Psychiatric: Mood, memory and affect normal.    Lab Results  Component Value Date   TSH 3.10 10/20/2014   Lab Results  Component Value Date   WBC 5.2 10/20/2014   HGB 12.9 10/20/2014   HCT 40.0 10/20/2014   MCV 86.6 10/20/2014   PLT 281.0 10/20/2014   Lab Results  Component Value Date   CREATININE 0.7 10/20/2014   BUN 19 10/20/2014   NA 140 10/20/2014   K 4.3 10/20/2014   CL 111 10/20/2014   CO2 24 10/20/2014   Lab Results  Component Value Date   ALT 11 10/20/2014   AST 12 10/20/2014   ALKPHOS 85 10/20/2014   BILITOT 0.9 10/20/2014   Lab Results  Component Value Date    CHOL 198 10/20/2014   Lab Results  Component Value Date   HDL 69.90 10/20/2014   Lab Results  Component Value Date   LDLCALC 115* 10/20/2014   Lab Results  Component Value Date   TRIG 66.0 10/20/2014   Lab Results  Component Value Date   CHOLHDL 3 10/20/2014     Assessment & Plan  Preventative health care Agrees to referral to gastroenterology for screening colonoscopy. Patient encouraged to maintain heart healthy diet, regular exercise, adequate sleep. Consider daily probiotics. Take medications as prescribed  Overweight Encouraged DASH diet, decrease po intake and increase exercise as tolerated. Needs 7-8 hours of sleep nightly. Avoid trans fats, eat small, frequent meals every 4-5  hours with lean proteins, complex carbs and healthy fats. Minimize simple carbs, GMO foods.

## 2014-10-30 NOTE — Assessment & Plan Note (Signed)
Encouraged DASH diet, decrease po intake and increase exercise as tolerated. Needs 7-8 hours of sleep nightly. Avoid trans fats, eat small, frequent meals every 4-5 hours with lean proteins, complex carbs and healthy fats. Minimize simple carbs, GMO foods. 

## 2014-10-31 ENCOUNTER — Encounter: Payer: Self-pay | Admitting: Family Medicine

## 2015-02-17 ENCOUNTER — Encounter: Payer: Self-pay | Admitting: Gastroenterology

## 2015-03-01 LAB — HM MAMMOGRAPHY

## 2015-03-14 ENCOUNTER — Encounter: Payer: Self-pay | Admitting: Family Medicine

## 2015-04-04 ENCOUNTER — Ambulatory Visit (AMBULATORY_SURGERY_CENTER): Payer: Self-pay | Admitting: *Deleted

## 2015-04-04 VITALS — Ht 66.0 in | Wt 187.0 lb

## 2015-04-04 DIAGNOSIS — Z1211 Encounter for screening for malignant neoplasm of colon: Secondary | ICD-10-CM

## 2015-04-04 MED ORDER — PEG-KCL-NACL-NASULF-NA ASC-C 100 G PO SOLR: 1.0000 | Freq: Once | ORAL | Status: DC

## 2015-04-04 NOTE — Progress Notes (Signed)
No allergies to soy or eggs   No diet medication   No reaction to sedation  No home O2

## 2015-04-17 ENCOUNTER — Encounter: Payer: Self-pay | Admitting: Gastroenterology

## 2015-04-17 ENCOUNTER — Ambulatory Visit (AMBULATORY_SURGERY_CENTER): Payer: 59 | Admitting: Gastroenterology

## 2015-04-17 VITALS — BP 112/79 | HR 59 | Temp 97.4°F | Resp 23 | Ht 66.0 in | Wt 187.0 lb

## 2015-04-17 DIAGNOSIS — D124 Benign neoplasm of descending colon: Secondary | ICD-10-CM | POA: Diagnosis not present

## 2015-04-17 DIAGNOSIS — Z1211 Encounter for screening for malignant neoplasm of colon: Secondary | ICD-10-CM

## 2015-04-17 MED ORDER — SODIUM CHLORIDE 0.9 % IV SOLN: 500.0000 mL | INTRAVENOUS | Status: DC

## 2015-04-17 NOTE — Patient Instructions (Signed)
YOU HAD AN ENDOSCOPIC PROCEDURE TODAY AT THE [facility]:   Refer to the procedure report that was given to you for any specific questions about what was found during the examination.  If the procedure report does not answer your questions, please call your gastroenterologist to clarify.  If you requested that your care partner not be given the details of your procedure findings, then the procedure report has been included in a sealed envelope for you to review at your convenience later.  YOU SHOULD EXPECT: Some feelings of bloating in the abdomen. Passage of more gas than usual.  Walking can help get rid of the air that was put into your GI tract during the procedure and reduce the bloating. If you had a lower endoscopy (such as a colonoscopy or flexible sigmoidoscopy) you may notice spotting of blood in your stool or on the toilet paper. If you underwent a bowel prep for your procedure, you may not have a normal bowel movement for a few days.  Please Note:  You might notice some irritation and congestion in your nose or some drainage.  This is from the oxygen used during your procedure.  There is no need for concern and it should clear up in a day or so.  SYMPTOMS TO REPORT IMMEDIATELY:   Following lower endoscopy (colonoscopy or flexible sigmoidoscopy):  Excessive amounts of blood in the stool  Significant tenderness or worsening of abdominal pains  Swelling of the abdomen that is new, acute  Fever of 100F or higher   For urgent or emergent issues, a gastroenterologist can be reached at any hour by calling [phone].   DIET: Your first meal following the procedure should be a small meal and then it is ok to progress to your normal diet. Heavy or fried foods are harder to digest and may make you feel nauseous or bloated.  Likewise, meals heavy in dairy and vegetables can increase bloating.  Drink plenty of fluids but you should avoid alcoholic beverages for 24  hours.  ACTIVITY:  You should plan to take it easy for the rest of today and you should NOT DRIVE or use heavy machinery until tomorrow (because of the sedation medicines used during the test).    FOLLOW UP: Our staff will call the number listed on your records the next business day following your procedure to check on you and address any questions or concerns that you may have regarding the information given to you following your procedure. If we do not reach you, we will leave a message.  However, if you are feeling well and you are not experiencing any problems, there is no need to return our call.  We will assume that you have returned to your regular daily activities without incident.  If any biopsies were taken you will be contacted by phone or by letter within the next 1-3 weeks.  Please call us at [phone] if you have not heard about the biopsies in 3 weeks.    SIGNATURES/CONFIDENTIALITY: You and/or your care partner have signed paperwork which will be entered into your electronic medical record.  These signatures attest to the fact that that the information above on your After Visit Summary has been reviewed and is understood.  Full responsibility of the confidentiality of this discharge information lies with you and/or your care-partner.  Read all handouts given to you by your recovery room nurse. 

## 2015-04-17 NOTE — Progress Notes (Signed)
Called to room to assist during endoscopic procedure.  Patient ID and intended procedure confirmed with present staff. Received instructions for my participation in the procedure from the performing physician.  

## 2015-04-17 NOTE — Progress Notes (Signed)
To recovery, reportr to [employer], Therapist, sports, VSS

## 2015-04-17 NOTE — Op Note (Signed)
[place]  [address] [place], [id number]   COLONOSCOPY PROCEDURE REPORT  PATIENT: Kristen Dunlap, Kristen Dunlap  MR#: [id number] BIRTHDATE: Mar 09, 1961 , 53  yrs. old GENDER: female ENDOSCOPIST: Milus Banister, MD REFERRED [id number] Charlett Blake, M.D. PROCEDURE DATE:  04/17/2015 PROCEDURE:   Colonoscopy, screening and Colonoscopy with snare polypectomy First Screening Colonoscopy - Avg.  risk and is 50 yrs.  old or older Yes.  Prior Negative Screening - Now for repeat screening. N/A  History of Adenoma - Now for follow-up colonoscopy & has been > or = to 3 yrs.  N/A ASA CLASS:   Class II INDICATIONS:Screening for colonic neoplasia and Colorectal Neoplasm Risk Assessment for this procedure is average risk. MEDICATIONS: Monitored anesthesia care and Propofol 200 mg IV  DESCRIPTION OF PROCEDURE:   After the risks benefits and alternatives of the procedure were thoroughly explained, informed consent was obtained.  The digital rectal exam revealed no abnormalities of the rectum.   The LB [id number]  endoscope was introduced through the anus and advanced to the cecum, which was identified by both the appendix and ileocecal valve. No adverse events experienced.   The quality of the prep was excellent.  The instrument was then slowly withdrawn as the colon was fully examined.   COLON FINDINGS: A sessile polyp measuring 3 mm in size was found in the sigmoid colon.  A polypectomy was performed with a cold snare. The resection was complete, the polyp tissue was completely retrieved and sent to histology.   The examination was otherwise normal.  Retroflexed views revealed no abnormalities. The time to cecum = 1.8 Withdrawal time = 8.0   The scope was withdrawn and the procedure completed. COMPLICATIONS: There were no immediate complications.  ENDOSCOPIC IMPRESSION: 1.   Sessile polyp was found, removed and sent to pathology 2.   The examination was otherwise  normal  RECOMMENDATIONS: If the polyp(s) removed today are proven to be adenomatous (pre-cancerous) polyps, you will need a repeat colonoscopy in 5 years.  Otherwise you should continue to follow colorectal cancer screening guidelines for "routine risk" patients with colonoscopy in 10 years.  You will receive a letter within 1-2 weeks with the results of your biopsy as well as final recommendations.  Please call my office if you have not received a letter after 3 weeks.  eSigned:  Milus Banister, MD 04/17/2015 8:12 AM

## 2015-04-18 ENCOUNTER — Telehealth: Payer: Self-pay | Admitting: *Deleted

## 2015-04-18 NOTE — Telephone Encounter (Signed)
  Follow up Call-  Call back number 04/17/2015  Post procedure Call Back phone  # [phone]  Permission to leave phone message Yes     Patient questions:  Do you have a fever, pain , or abdominal swelling? No. Pain Score  0 *  Have you tolerated food without any problems? Yes.    Have you been able to return to your normal activities? Yes.    Do you have any questions about your discharge instructions: Diet   No. Medications  No. Follow up visit  No.  Do you have questions or concerns about your Care? No.  Actions: * If pain score is 4 or above: No action needed, pain <4.

## 2015-04-25 ENCOUNTER — Encounter: Payer: Self-pay | Admitting: Gastroenterology

## 2015-10-26 ENCOUNTER — Telehealth: Payer: Self-pay | Admitting: Behavioral Health

## 2015-10-26 ENCOUNTER — Encounter: Payer: Self-pay | Admitting: Behavioral Health

## 2015-10-26 NOTE — Telephone Encounter (Signed)
Pre-Visit Call completed with patient and chart updated.   Pre-Visit Info documented in Specialty Comments under [employer].    

## 2015-10-27 ENCOUNTER — Ambulatory Visit (INDEPENDENT_AMBULATORY_CARE_PROVIDER_SITE_OTHER): Payer: Commercial Managed Care - HMO | Admitting: Family Medicine

## 2015-10-27 ENCOUNTER — Encounter: Payer: Self-pay | Admitting: Family Medicine

## 2015-10-27 VITALS — BP 124/88 | HR 67 | Temp 97.8°F | Ht 66.0 in | Wt 168.4 lb

## 2015-10-27 DIAGNOSIS — E782 Mixed hyperlipidemia: Secondary | ICD-10-CM

## 2015-10-27 DIAGNOSIS — R8299 Other abnormal findings in urine: Secondary | ICD-10-CM | POA: Diagnosis not present

## 2015-10-27 DIAGNOSIS — Z0000 Encounter for general adult medical examination without abnormal findings: Secondary | ICD-10-CM

## 2015-10-27 DIAGNOSIS — R82998 Other abnormal findings in urine: Secondary | ICD-10-CM

## 2015-10-27 DIAGNOSIS — E663 Overweight: Secondary | ICD-10-CM

## 2015-10-27 DIAGNOSIS — D649 Anemia, unspecified: Secondary | ICD-10-CM | POA: Diagnosis not present

## 2015-10-27 LAB — CBC
HCT: 35.8 % — ABNORMAL LOW (ref 36.0–46.0)
HEMOGLOBIN: 11.7 g/dL — AB (ref 12.0–15.0)
MCHC: 32.7 g/dL (ref 30.0–36.0)
MCV: 84.3 fl (ref 78.0–100.0)
PLATELETS: 304 10*3/uL (ref 150.0–400.0)
RBC: 4.25 Mil/uL (ref 3.87–5.11)
RDW: 13.8 % (ref 11.5–15.5)
WBC: 4.5 10*3/uL (ref 4.0–10.5)

## 2015-10-27 LAB — COMPREHENSIVE METABOLIC PANEL
ALK PHOS: 86 U/L (ref 39–117)
ALT: 10 U/L (ref 0–35)
AST: 11 U/L (ref 0–37)
Albumin: 4.2 g/dL (ref 3.5–5.2)
BUN: 19 mg/dL (ref 6–23)
CO2: 27 mEq/L (ref 19–32)
Calcium: 10.2 mg/dL (ref 8.4–10.5)
Chloride: 109 mEq/L (ref 96–112)
Creatinine, Ser: 0.6 mg/dL (ref 0.40–1.20)
GFR: 111.08 mL/min (ref >60.00)
Glucose, Bld: 98 mg/dL (ref 70–99)
Potassium: 4.2 mEq/L (ref 3.5–5.1)
Sodium: 143 mEq/L (ref 135–145)
Total Bilirubin: 0.7 mg/dL (ref 0.2–1.2)
Total Protein: 6.9 g/dL (ref 6.0–8.3)

## 2015-10-27 LAB — LIPID PANEL
CHOL/HDL RATIO: 3
Cholesterol: 197 mg/dL (ref 0–200)
HDL: 68.8 mg/dL (ref >39.00)
LDL CALC: 119 mg/dL — AB (ref 0–99)
NONHDL: 128.45
Triglycerides: 46 mg/dL (ref 0.0–149.0)
VLDL: 9.2 mg/dL (ref 0.0–40.0)

## 2015-10-27 LAB — TSH: TSH: 2.42 u[IU]/mL (ref 0.35–4.50)

## 2015-10-27 NOTE — Patient Instructions (Signed)
Consider Vitamin D 2000 IU daily and Krill or Fish oil daily   Preventive Care for Adults, Female A healthy lifestyle and preventive care can promote health and wellness. Preventive health guidelines for women include the following key practices.  A routine yearly physical is a good way to check with your health care provider about your health and preventive screening. It is a chance to share any concerns and updates on your health and to receive a thorough exam.  Visit your dentist for a routine exam and preventive care every 6 months. Brush your teeth twice a day and floss once a day. Good oral hygiene prevents tooth decay and gum disease.  The frequency of eye exams is based on your age, health, family medical history, use of contact lenses, and other factors. Follow your health care provider's recommendations for frequency of eye exams.  Eat a healthy diet. Foods like vegetables, fruits, whole grains, low-fat dairy products, and lean protein foods contain the nutrients you need without too many calories. Decrease your intake of foods high in solid fats, added sugars, and salt. Eat the right amount of calories for you.Get information about a proper diet from your health care provider, if necessary.  Regular physical exercise is one of the most important things you can do for your health. Most adults should get at least 150 minutes of moderate-intensity exercise (any activity that increases your heart rate and causes you to sweat) each week. In addition, most adults need muscle-strengthening exercises on 2 or more days a week.  Maintain a healthy weight. The body mass index (BMI) is a screening tool to identify possible weight problems. It provides an estimate of body fat based on height and weight. Your health care provider can find your BMI and can help you achieve or maintain a healthy weight.For adults 20 years and older:  A BMI below 18.5 is considered underweight.  A BMI of 18.5 to 24.9  is normal.  A BMI of 25 to 29.9 is considered overweight.  A BMI of 30 and above is considered obese.  Maintain normal blood lipids and cholesterol levels by exercising and minimizing your intake of saturated fat. Eat a balanced diet with plenty of fruit and vegetables. Blood tests for lipids and cholesterol should begin at age 72 and be repeated every 5 years. If your lipid or cholesterol levels are high, you are over 50, or you are at high risk for heart disease, you may need your cholesterol levels checked more frequently.Ongoing high lipid and cholesterol levels should be treated with medicines if diet and exercise are not working.  If you smoke, find out from your health care provider how to quit. If you do not use tobacco, do not start.  Lung cancer screening is recommended for adults aged 53-80 years who are at high risk for developing lung cancer because of a history of smoking. A yearly low-dose CT scan of the lungs is recommended for people who have at least a 30-pack-year history of smoking and are a current smoker or have quit within the past 15 years. A pack year of smoking is smoking an average of 1 pack of cigarettes a day for 1 year (for example: 1 pack a day for 30 years or 2 packs a day for 15 years). Yearly screening should continue until the smoker has stopped smoking for at least 15 years. Yearly screening should be stopped for people who develop a health problem that would prevent them from having lung  cancer treatment.  If you are pregnant, do not drink alcohol. If you are breastfeeding, be very cautious about drinking alcohol. If you are not pregnant and choose to drink alcohol, do not have more than 1 drink per day. One drink is considered to be 12 ounces (355 mL) of beer, 5 ounces (148 mL) of wine, or 1.5 ounces (44 mL) of liquor.  Avoid use of street drugs. Do not share needles with anyone. Ask for help if you need support or instructions about stopping the use of  drugs.  High blood pressure causes heart disease and increases the risk of stroke. Your blood pressure should be checked at least every 1 to 2 years. Ongoing high blood pressure should be treated with medicines if weight loss and exercise do not work.  If you are 55-79 years old, ask your health care provider if you should take aspirin to prevent strokes.  Diabetes screening is done by taking a blood sample to check your blood glucose level after you have not eaten for a certain period of time (fasting). If you are not overweight and you do not have risk factors for diabetes, you should be screened once every 3 years starting at age 45. If you are overweight or obese and you are 40-70 years of age, you should be screened for diabetes every year as part of your cardiovascular risk assessment.  Breast cancer screening is essential preventive care for women. You should practice "breast self-awareness." This means understanding the normal appearance and feel of your breasts and may include breast self-examination. Any changes detected, no matter how small, should be reported to a health care provider. Women in their 20s and 30s should have a clinical breast exam (CBE) by a health care provider as part of a regular health exam every 1 to 3 years. After age 40, women should have a CBE every year. Starting at age 40, women should consider having a mammogram (breast X-ray test) every year. Women who have a family history of breast cancer should talk to their health care provider about genetic screening. Women at a high risk of breast cancer should talk to their health care providers about having an MRI and a mammogram every year.  Breast cancer gene (BRCA)-related cancer risk assessment is recommended for women who have family members with BRCA-related cancers. BRCA-related cancers include breast, ovarian, tubal, and peritoneal cancers. Having family members with these cancers may be associated with an increased  risk for harmful changes (mutations) in the breast cancer genes BRCA1 and BRCA2. Results of the assessment will determine the need for genetic counseling and BRCA1 and BRCA2 testing.  Your health care provider may recommend that you be screened regularly for cancer of the pelvic organs (ovaries, uterus, and vagina). This screening involves a pelvic examination, including checking for microscopic changes to the surface of your cervix (Pap test). You may be encouraged to have this screening done every 3 years, beginning at age 21.  For women ages 30-65, health care providers may recommend pelvic exams and Pap testing every 3 years, or they may recommend the Pap and pelvic exam, combined with testing for human papilloma virus (HPV), every 5 years. Some types of HPV increase your risk of cervical cancer. Testing for HPV may also be done on women of any age with unclear Pap test results.  Other health care providers may not recommend any screening for nonpregnant women who are considered low risk for pelvic cancer and who do not have   symptoms. Ask your health care provider if a screening pelvic exam is right for you.  If you have had past treatment for cervical cancer or a condition that could lead to cancer, you need Pap tests and screening for cancer for at least 20 years after your treatment. If Pap tests have been discontinued, your risk factors (such as having a new sexual partner) need to be reassessed to determine if screening should resume. Some women have medical problems that increase the chance of getting cervical cancer. In these cases, your health care provider may recommend more frequent screening and Pap tests.  Colorectal cancer can be detected and often prevented. Most routine colorectal cancer screening begins at the age of 50 years and continues through age 75 years. However, your health care provider may recommend screening at an earlier age if you have risk factors for colon cancer. On a  yearly basis, your health care provider may provide home test kits to check for hidden blood in the stool. Use of a small camera at the end of a tube, to directly examine the colon (sigmoidoscopy or colonoscopy), can detect the earliest forms of colorectal cancer. Talk to your health care provider about this at age 50, when routine screening begins. Direct exam of the colon should be repeated every 5-10 years through age 75 years, unless early forms of precancerous polyps or small growths are found.  People who are at an increased risk for hepatitis B should be screened for this virus. You are considered at high risk for hepatitis B if:  You were born in a country where hepatitis B occurs often. Talk with your health care provider about which countries are considered high risk.  Your parents were born in a high-risk country and you have not received a shot to protect against hepatitis B (hepatitis B vaccine).  You have HIV or AIDS.  You use needles to inject street drugs.  You live with, or have sex with, someone who has hepatitis B.  You get hemodialysis treatment.  You take certain medicines for conditions like cancer, organ transplantation, and autoimmune conditions.  Hepatitis C blood testing is recommended for all people born from 1945 through 1965 and any individual with known risks for hepatitis C.  Practice safe sex. Use condoms and avoid high-risk sexual practices to reduce the spread of sexually transmitted infections (STIs). STIs include gonorrhea, chlamydia, syphilis, trichomonas, herpes, HPV, and human immunodeficiency virus (HIV). Herpes, HIV, and HPV are viral illnesses that have no cure. They can result in disability, cancer, and death.  You should be screened for sexually transmitted illnesses (STIs) including gonorrhea and chlamydia if:  You are sexually active and are younger than 24 years.  You are older than 24 years and your health care provider tells you that you are  at risk for this type of infection.  Your sexual activity has changed since you were last screened and you are at an increased risk for chlamydia or gonorrhea. Ask your health care provider if you are at risk.  If you are at risk of being infected with HIV, it is recommended that you take a prescription medicine daily to prevent HIV infection. This is called preexposure prophylaxis (PrEP). You are considered at risk if:  You are sexually active and do not regularly use condoms or know the HIV status of your partner(s).  You take drugs by injection.  You are sexually active with a partner who has HIV.  Talk with your health care   provider about whether you are at high risk of being infected with HIV. If you choose to begin PrEP, you should first be tested for HIV. You should then be tested every 3 months for as long as you are taking PrEP.  Osteoporosis is a disease in which the bones lose minerals and strength with aging. This can result in serious bone fractures or breaks. The risk of osteoporosis can be identified using a bone density scan. Women ages 65 years and over and women at risk for fractures or osteoporosis should discuss screening with their health care providers. Ask your health care provider whether you should take a calcium supplement or vitamin D to reduce the rate of osteoporosis.  Menopause can be associated with physical symptoms and risks. Hormone replacement therapy is available to decrease symptoms and risks. You should talk to your health care provider about whether hormone replacement therapy is right for you.  Use sunscreen. Apply sunscreen liberally and repeatedly throughout the day. You should seek shade when your shadow is shorter than you. Protect yourself by wearing long sleeves, pants, a wide-brimmed hat, and sunglasses year round, whenever you are outdoors.  Once a month, do a whole body skin exam, using a mirror to look at the skin on your back. Tell your health  care provider of new moles, moles that have irregular borders, moles that are larger than a pencil eraser, or moles that have changed in shape or color.  Stay current with required vaccines (immunizations).  Influenza vaccine. All adults should be immunized every year.  Tetanus, diphtheria, and acellular pertussis (Td, Tdap) vaccine. Pregnant women should receive 1 dose of Tdap vaccine during each pregnancy. The dose should be obtained regardless of the length of time since the last dose. Immunization is preferred during the 27th-36th week of gestation. An adult who has not previously received Tdap or who does not know her vaccine status should receive 1 dose of Tdap. This initial dose should be followed by tetanus and diphtheria toxoids (Td) booster doses every 10 years. Adults with an unknown or incomplete history of completing a 3-dose immunization series with Td-containing vaccines should begin or complete a primary immunization series including a Tdap dose. Adults should receive a Td booster every 10 years.  Varicella vaccine. An adult without evidence of immunity to varicella should receive 2 doses or a second dose if she has previously received 1 dose. Pregnant females who do not have evidence of immunity should receive the first dose after pregnancy. This first dose should be obtained before leaving the health care facility. The second dose should be obtained 4-8 weeks after the first dose.  Human papillomavirus (HPV) vaccine. Females aged 13-26 years who have not received the vaccine previously should obtain the 3-dose series. The vaccine is not recommended for use in pregnant females. However, pregnancy testing is not needed before receiving a dose. If a female is found to be pregnant after receiving a dose, no treatment is needed. In that case, the remaining doses should be delayed until after the pregnancy. Immunization is recommended for any person with an immunocompromised condition through  the age of 26 years if she did not get any or all doses earlier. During the 3-dose series, the second dose should be obtained 4-8 weeks after the first dose. The third dose should be obtained 24 weeks after the first dose and 16 weeks after the second dose.  Zoster vaccine. One dose is recommended for adults aged 60 years or older   unless certain conditions are present.  Measles, mumps, and rubella (MMR) vaccine. Adults born before [birth year] generally are considered immune to measles and mumps. Adults born in [birth year] or later should have 1 or more doses of MMR vaccine unless there is a contraindication to the vaccine or there is laboratory evidence of immunity to each of the three diseases. A routine second dose of MMR vaccine should be obtained at least 28 days after the first dose for students attending postsecondary schools, health care workers, or international travelers. People who received inactivated measles vaccine or an unknown type of measles vaccine during 1963-1967 should receive 2 doses of MMR vaccine. People who received inactivated mumps vaccine or an unknown type of mumps vaccine before 1979 and are at high risk for mumps infection should consider immunization with 2 doses of MMR vaccine. For females of childbearing age, rubella immunity should be determined. If there is no evidence of immunity, females who are not pregnant should be vaccinated. If there is no evidence of immunity, females who are pregnant should delay immunization until after pregnancy. Unvaccinated health care workers born before [birth year] who lack laboratory evidence of measles, mumps, or rubella immunity or laboratory confirmation of disease should consider measles and mumps immunization with 2 doses of MMR vaccine or rubella immunization with 1 dose of MMR vaccine.  Pneumococcal 13-valent conjugate (PCV13) vaccine. When indicated, a person who is uncertain of his immunization history and has no record of immunization should receive the  PCV13 vaccine. All adults 67 years of age and older should receive this vaccine. An adult aged 15 years or older who has certain medical conditions and has not been previously immunized should receive 1 dose of PCV13 vaccine. This PCV13 should be followed with a dose of pneumococcal polysaccharide (PPSV23) vaccine. Adults who are at high risk for pneumococcal disease should obtain the PPSV23 vaccine at least 8 weeks after the dose of PCV13 vaccine. Adults older than 53 years of age who have normal immune system function should obtain the PPSV23 vaccine dose at least 1 year after the dose of PCV13 vaccine.  Pneumococcal polysaccharide (PPSV23) vaccine. When PCV13 is also indicated, PCV13 should be obtained first. All adults aged 68 years and older should be immunized. An adult younger than age 24 years who has certain medical conditions should be immunized. Any person who resides in a nursing home or long-term care facility should be immunized. An adult smoker should be immunized. People with an immunocompromised condition and certain other conditions should receive both PCV13 and PPSV23 vaccines. People with human immunodeficiency virus (HIV) infection should be immunized as soon as possible after diagnosis. Immunization during chemotherapy or radiation therapy should be avoided. Routine use of PPSV23 vaccine is not recommended for American Indians, [place] Natives, or people younger than 65 years unless there are medical conditions that require PPSV23 vaccine. When indicated, people who have unknown immunization and have no record of immunization should receive PPSV23 vaccine. One-time revaccination 5 years after the first dose of PPSV23 is recommended for people aged 19-64 years who have chronic kidney failure, nephrotic syndrome, asplenia, or immunocompromised conditions. People who received 1-2 doses of PPSV23 before age 11 years should receive another dose of PPSV23 vaccine at age 25 years or later if at least  5 years have passed since the previous dose. Doses of PPSV23 are not needed for people immunized with PPSV23 at or after age 12 years.  Meningococcal vaccine. Adults with asplenia or persistent complement component deficiencies should  receive 2 doses of quadrivalent meningococcal conjugate (MenACWY-D) vaccine. The doses should be obtained at least 2 months apart. Microbiologists working with certain meningococcal bacteria, military recruits, people at risk during an outbreak, and people who travel to or live in countries with a high rate of meningitis should be immunized. A first-year college student up through age 21 years who is living in a residence hall should receive a dose if she did not receive a dose on or after her 16th birthday. Adults who have certain high-risk conditions should receive one or more doses of vaccine.  Hepatitis A vaccine. Adults who wish to be protected from this disease, have certain high-risk conditions, work with hepatitis A-infected animals, work in hepatitis A research labs, or travel to or work in countries with a high rate of hepatitis A should be immunized. Adults who were previously unvaccinated and who anticipate close contact with an international adoptee during the first 60 days after arrival in the [place] from a country with a high rate of hepatitis A should be immunized.  Hepatitis B vaccine. Adults who wish to be protected from this disease, have certain high-risk conditions, may be exposed to blood or other infectious body fluids, are household contacts or sex partners of hepatitis B positive people, are clients or workers in certain care facilities, or travel to or work in countries with a high rate of hepatitis B should be immunized.  Haemophilus influenzae type b (Hib) vaccine. A previously unvaccinated person with asplenia or sickle cell disease or having a scheduled splenectomy should receive 1 dose of Hib vaccine. Regardless of previous immunization, a  recipient of a hematopoietic stem cell transplant should receive a 3-dose series 6-12 months after her successful transplant. Hib vaccine is not recommended for adults with HIV infection. Preventive Services / Frequency Ages 19 to 39 years  Blood pressure check.** / Every 3-5 years.  Lipid and cholesterol check.** / Every 5 years beginning at age 20.  Clinical breast exam.** / Every 3 years for women in their 20s and 30s.  BRCA-related cancer risk assessment.** / For women who have family members with a BRCA-related cancer (breast, ovarian, tubal, or peritoneal cancers).  Pap test.** / Every 2 years from ages 21 through 29. Every 3 years starting at age 30 through age 65 or 70 with a history of 3 consecutive normal Pap tests.  HPV screening.** / Every 3 years from ages 30 through ages 65 to 70 with a history of 3 consecutive normal Pap tests.  Hepatitis C blood test.** / For any individual with known risks for hepatitis C.  Skin self-exam. / Monthly.  Influenza vaccine. / Every year.  Tetanus, diphtheria, and acellular pertussis (Tdap, Td) vaccine.** / Consult your health care provider. Pregnant women should receive 1 dose of Tdap vaccine during each pregnancy. 1 dose of Td every 10 years.  Varicella vaccine.** / Consult your health care provider. Pregnant females who do not have evidence of immunity should receive the first dose after pregnancy.  HPV vaccine. / 3 doses over 6 months, if 26 and younger. The vaccine is not recommended for use in pregnant females. However, pregnancy testing is not needed before receiving a dose.  Measles, mumps, rubella (MMR) vaccine.** / You need at least 1 dose of MMR if you were born in 1957 or later. You may also need a 2nd dose. For females of childbearing age, rubella immunity should be determined. If there is no evidence of immunity, females who are not   pregnant should be vaccinated. If there is no evidence of immunity, females who are pregnant  should delay immunization until after pregnancy.  Pneumococcal 13-valent conjugate (PCV13) vaccine.** / Consult your health care provider.  Pneumococcal polysaccharide (PPSV23) vaccine.** / 1 to 2 doses if you smoke cigarettes or if you have certain conditions.  Meningococcal vaccine.** / 1 dose if you are age 19 to 21 years and a first-year college student living in a residence hall, or have one of several medical conditions, you need to get vaccinated against meningococcal disease. You may also need additional booster doses.  Hepatitis A vaccine.** / Consult your health care provider.  Hepatitis B vaccine.** / Consult your health care provider.  Haemophilus influenzae type b (Hib) vaccine.** / Consult your health care provider. Ages 40 to 64 years  Blood pressure check.** / Every year.  Lipid and cholesterol check.** / Every 5 years beginning at age 20 years.  Lung cancer screening. / Every year if you are aged 55-80 years and have a 30-pack-year history of smoking and currently smoke or have quit within the past 15 years. Yearly screening is stopped once you have quit smoking for at least 15 years or develop a health problem that would prevent you from having lung cancer treatment.  Clinical breast exam.** / Every year after age 40 years.  BRCA-related cancer risk assessment.** / For women who have family members with a BRCA-related cancer (breast, ovarian, tubal, or peritoneal cancers).  Mammogram.** / Every year beginning at age 40 years and continuing for as long as you are in good health. Consult with your health care provider.  Pap test.** / Every 3 years starting at age 30 years through age 65 or 70 years with a history of 3 consecutive normal Pap tests.  HPV screening.** / Every 3 years from ages 30 years through ages 65 to 70 years with a history of 3 consecutive normal Pap tests.  Fecal occult blood test (FOBT) of stool. / Every year beginning at age 50 years and  continuing until age 75 years. You may not need to do this test if you get a colonoscopy every 10 years.  Flexible sigmoidoscopy or colonoscopy.** / Every 5 years for a flexible sigmoidoscopy or every 10 years for a colonoscopy beginning at age 50 years and continuing until age 75 years.  Hepatitis C blood test.** / For all people born from 1945 through 1965 and any individual with known risks for hepatitis C.  Skin self-exam. / Monthly.  Influenza vaccine. / Every year.  Tetanus, diphtheria, and acellular pertussis (Tdap/Td) vaccine.** / Consult your health care provider. Pregnant women should receive 1 dose of Tdap vaccine during each pregnancy. 1 dose of Td every 10 years.  Varicella vaccine.** / Consult your health care provider. Pregnant females who do not have evidence of immunity should receive the first dose after pregnancy.  Zoster vaccine.** / 1 dose for adults aged 60 years or older.  Measles, mumps, rubella (MMR) vaccine.** / You need at least 1 dose of MMR if you were born in 1957 or later. You may also need a second dose. For females of childbearing age, rubella immunity should be determined. If there is no evidence of immunity, females who are not pregnant should be vaccinated. If there is no evidence of immunity, females who are pregnant should delay immunization until after pregnancy.  Pneumococcal 13-valent conjugate (PCV13) vaccine.** / Consult your health care provider.  Pneumococcal polysaccharide (PPSV23) vaccine.** / 1 to 2 doses   if you smoke cigarettes or if you have certain conditions.  Meningococcal vaccine.** / Consult your health care provider.  Hepatitis A vaccine.** / Consult your health care provider.  Hepatitis B vaccine.** / Consult your health care provider.  Haemophilus influenzae type b (Hib) vaccine.** / Consult your health care provider. Ages 65 years and over  Blood pressure check.** / Every year.  Lipid and cholesterol check.** / Every 5 years  beginning at age 20 years.  Lung cancer screening. / Every year if you are aged 55-80 years and have a 30-pack-year history of smoking and currently smoke or have quit within the past 15 years. Yearly screening is stopped once you have quit smoking for at least 15 years or develop a health problem that would prevent you from having lung cancer treatment.  Clinical breast exam.** / Every year after age 40 years.  BRCA-related cancer risk assessment.** / For women who have family members with a BRCA-related cancer (breast, ovarian, tubal, or peritoneal cancers).  Mammogram.** / Every year beginning at age 40 years and continuing for as long as you are in good health. Consult with your health care provider.  Pap test.** / Every 3 years starting at age 30 years through age 65 or 70 years with 3 consecutive normal Pap tests. Testing can be stopped between 65 and 70 years with 3 consecutive normal Pap tests and no abnormal Pap or HPV tests in the past 10 years.  HPV screening.** / Every 3 years from ages 30 years through ages 65 or 70 years with a history of 3 consecutive normal Pap tests. Testing can be stopped between 65 and 70 years with 3 consecutive normal Pap tests and no abnormal Pap or HPV tests in the past 10 years.  Fecal occult blood test (FOBT) of stool. / Every year beginning at age 50 years and continuing until age 75 years. You may not need to do this test if you get a colonoscopy every 10 years.  Flexible sigmoidoscopy or colonoscopy.** / Every 5 years for a flexible sigmoidoscopy or every 10 years for a colonoscopy beginning at age 50 years and continuing until age 75 years.  Hepatitis C blood test.** / For all people born from 1945 through 1965 and any individual with known risks for hepatitis C.  Osteoporosis screening.** / A one-time screening for women ages 65 years and over and women at risk for fractures or osteoporosis.  Skin self-exam. / Monthly.  Influenza vaccine. /  Every year.  Tetanus, diphtheria, and acellular pertussis (Tdap/Td) vaccine.** / 1 dose of Td every 10 years.  Varicella vaccine.** / Consult your health care provider.  Zoster vaccine.** / 1 dose for adults aged 60 years or older.  Pneumococcal 13-valent conjugate (PCV13) vaccine.** / Consult your health care provider.  Pneumococcal polysaccharide (PPSV23) vaccine.** / 1 dose for all adults aged 65 years and older.  Meningococcal vaccine.** / Consult your health care provider.  Hepatitis A vaccine.** / Consult your health care provider.  Hepatitis B vaccine.** / Consult your health care provider.  Haemophilus influenzae type b (Hib) vaccine.** / Consult your health care provider. ** Family history and personal history of risk and conditions may change your health care provider's recommendations.   This information is not intended to replace advice given to you by your health care provider. Make sure you discuss any questions you have with your health care provider.   Document Released: 02/11/2002 Document Revised: 01/06/2015 Document Reviewed: 05/13/2011 Elsevier Interactive Patient Education 2016   [employer].

## 2015-10-27 NOTE — Progress Notes (Signed)
Pre visit review using our clinic review tool, if applicable. No additional management support is needed unless otherwise documented below in the visit note. 

## 2015-11-09 ENCOUNTER — Encounter: Payer: Self-pay | Admitting: Family Medicine

## 2015-11-09 DIAGNOSIS — D649 Anemia, unspecified: Secondary | ICD-10-CM

## 2015-11-09 HISTORY — DX: Anemia, unspecified: D64.9

## 2015-11-09 NOTE — Progress Notes (Signed)
Subjective:    Patient ID: Kristen Dunlap, female    DOB: 06/10/1961, 54 y.o.   MRN: [id number]  Chief Complaint  Patient presents with  . Annual Exam    HPI Patient is in today for annual exam. She feels well. No recent illness. Has been trying to maintain a heart healthy diet and stay active. She did lose her father in April 2016 but feels she is managing OK. No anhedonia or severe depression.  Past Medical History  Diagnosis Date  . Personal history of kidney stones   . Heel spur     right  . Overweight(278.02)   . Postmenopausal   . Postmenopausal bleeding 10/01/2011  . Fever blister 03/09/2013  . Other and unspecified hyperlipidemia 03/14/2013  . Anemia 11/09/2015    Past Surgical History  Procedure Laterality Date  . Heel spur surgery  1998  . Cesarean section  1989 and 1991    X 2  . Tubal ligation  15 YRS AGO  . Tonsillectomy  AS CHILD  . Hysteroscopy  11/29/2011    Procedure: HYSTEROSCOPY;  Surgeon: Terrance Mass, MD;  Location: [facility];  Service: Gynecology;  Laterality: N/A;  with Myosure    Family History  Problem Relation Age of Onset  . Diabetes Father   . Cancer Father     throat ca/ smoker  . Dementia Father   . Cancer Paternal Grandmother     esophagus, cancer  . Other Sister 55    PLS  . Early death Sister   . Colon cancer Neg Hx     Social History   Social History  . Marital Status: Married    Spouse Name: N/A  . Number of Children: N/A  . Years of Education: N/A   Occupational History  . Not on file.   Social History Main Topics  . Smoking status: Never Smoker   . Smokeless tobacco: Never Used  . Alcohol Use: Yes     Comment: occasionaly  . Drug Use: No  . Sexual Activity:    Partners: Male     Comment: tubes tied, lives with husband, works in office setting, accounting, no dietary restrictions   Other Topics Concern  . Not on file   Social History Narrative    Outpatient Prescriptions Prior to Visit   Medication Sig Dispense Refill  . Ascorbic Acid (VITAMIN C) 500 MG tablet Take 500 mg by mouth daily.     . Polyethylene Glycol 3350 (MIRALAX PO) Take by mouth.    . Probiotic Product (PROBIOTIC DAILY) CAPS Take 1 capsule by mouth every other day.     No facility-administered medications prior to visit.    Allergies  Allergen Reactions  . Chlorhexidine Rash    Review of Systems  Constitutional: Negative for fever and malaise/fatigue.  HENT: Negative for congestion.   Eyes: Negative for discharge.  Respiratory: Negative for shortness of breath.   Cardiovascular: Negative for chest pain, palpitations and leg swelling.  Gastrointestinal: Negative for nausea and abdominal pain.  Genitourinary: Negative for dysuria.  Musculoskeletal: Negative for falls.  Skin: Negative for rash.  Neurological: Negative for loss of consciousness and headaches.  Endo/Heme/Allergies: Negative for environmental allergies.  Psychiatric/Behavioral: Negative for depression. The patient is not nervous/anxious.        Objective:    Physical Exam  Constitutional: She is oriented to person, place, and time. She appears well-developed and well-nourished. No distress.  HENT:  Head: Normocephalic and atraumatic.  Nose: Nose normal.  Eyes: Right eye exhibits no discharge. Left eye exhibits no discharge.  Neck: Normal range of motion. Neck supple.  Cardiovascular: Normal rate and regular rhythm.   No murmur heard. Pulmonary/Chest: Effort normal and breath sounds normal.  Abdominal: Soft. Bowel sounds are normal. There is no tenderness.  Musculoskeletal: She exhibits no edema.  Neurological: She is alert and oriented to person, place, and time.  Skin: Skin is warm and dry.  Psychiatric: She has a normal mood and affect.  Nursing note and vitals reviewed.   BP 124/88 mmHg  Pulse 67  Temp(Src) 97.8 F (36.6 C) (Oral)  Ht 5\' 6"  (1.676 m)  Wt 168 lb 6 oz (76.374 kg)  BMI 27.19 kg/m2  SpO2 97% Wt  Readings from Last 3 Encounters:  10/27/15 168 lb 6 oz (76.374 kg)  04/17/15 187 lb (84.823 kg)  04/04/15 187 lb (84.823 kg)     Lab Results  Component Value Date   WBC 4.5 10/27/2015   HGB 11.7* 10/27/2015   HCT 35.8* 10/27/2015   PLT 304.0 10/27/2015   GLUCOSE 98 10/27/2015   CHOL 197 10/27/2015   TRIG 46.0 10/27/2015   HDL 68.80 10/27/2015   LDLDIRECT 115.0 03/25/2011   LDLCALC 119* 10/27/2015   ALT 10 10/27/2015   AST 11 10/27/2015   NA 143 10/27/2015   K 4.2 10/27/2015   CL 109 10/27/2015   CREATININE 0.60 10/27/2015   BUN 19 10/27/2015   CO2 27 10/27/2015   TSH 2.42 10/27/2015   INR 0.87 09/30/2011    Lab Results  Component Value Date   TSH 2.42 10/27/2015   Lab Results  Component Value Date   WBC 4.5 10/27/2015   HGB 11.7* 10/27/2015   HCT 35.8* 10/27/2015   MCV 84.3 10/27/2015   PLT 304.0 10/27/2015   Lab Results  Component Value Date   NA 143 10/27/2015   K 4.2 10/27/2015   CO2 27 10/27/2015   GLUCOSE 98 10/27/2015   BUN 19 10/27/2015   CREATININE 0.60 10/27/2015   BILITOT 0.7 10/27/2015   ALKPHOS 86 10/27/2015   AST 11 10/27/2015   ALT 10 10/27/2015   PROT 6.9 10/27/2015   ALBUMIN 4.2 10/27/2015   CALCIUM 10.2 10/27/2015   GFR 111.08 10/27/2015   Lab Results  Component Value Date   CHOL 197 10/27/2015   Lab Results  Component Value Date   HDL 68.80 10/27/2015   Lab Results  Component Value Date   LDLCALC 119* 10/27/2015   Lab Results  Component Value Date   TRIG 46.0 10/27/2015   Lab Results  Component Value Date   CHOLHDL 3 10/27/2015   No results found for: HGBA1C     Assessment & Plan:   Problem List Items Addressed This Visit    Preventative health care    Patient encouraged to maintain heart healthy diet, regular exercise, adequate sleep. Consider daily probiotics. Take medications as prescribed. Declines Hep C and HIV testing since she donates blood. Declines Tdap for now.      Relevant Orders   CBC (Completed)    TSH (Completed)   Lipid panel (Completed)   Comprehensive metabolic panel (Completed)   Overweight    Encouraged DASH diet, decrease po intake and increase exercise as tolerated. Needs 7-8 hours of sleep nightly. Avoid trans fats, eat small, frequent meals every 4-5 hours with lean proteins, complex carbs and healthy fats. Minimize simple carbs, GMO foods.      Anemia  Other Visit Diagnoses    Hyperlipidemia, mixed    -  Primary    Relevant Orders    CBC (Completed)    TSH (Completed)    Lipid panel (Completed)    Comprehensive metabolic panel (Completed)    Low urine phosphorus        Relevant Orders    CBC (Completed)    TSH (Completed)    Lipid panel (Completed)    Comprehensive metabolic panel (Completed)       I am having Ms. Hamor maintain her vitamin C, Polyethylene Glycol 3350 (MIRALAX PO), and PROBIOTIC DAILY.  No orders of the defined types were placed in this encounter.     Penni Homans, MD

## 2015-11-09 NOTE — Assessment & Plan Note (Signed)
Encouraged heart healthy diet, increase exercise, avoid trans fats, consider a krill oil cap daily 

## 2015-11-09 NOTE — Assessment & Plan Note (Signed)
Encouraged DASH diet, decrease po intake and increase exercise as tolerated. Needs 7-8 hours of sleep nightly. Avoid trans fats, eat small, frequent meals every 4-5 hours with lean proteins, complex carbs and healthy fats. Minimize simple carbs, GMO foods. 

## 2015-11-09 NOTE — Assessment & Plan Note (Addendum)
Patient encouraged to maintain heart healthy diet, regular exercise, adequate sleep. Consider daily probiotics. Take medications as prescribed. Declines Hep C and HIV testing since she donates blood. Declines Tdap for now.

## 2016-04-18 LAB — HM MAMMOGRAPHY

## 2016-04-22 ENCOUNTER — Encounter: Payer: Self-pay | Admitting: Family Medicine

## 2016-10-29 ENCOUNTER — Encounter: Payer: Commercial Managed Care - HMO | Admitting: Family Medicine

## 2017-01-31 ENCOUNTER — Other Ambulatory Visit (HOSPITAL_COMMUNITY)
Admission: RE | Admit: 2017-01-31 | Discharge: 2017-01-31 | Disposition: A | Discharge status: Home / Self Care | Payer: BLUE CROSS/BLUE SHIELD | Source: Ambulatory Visit | Attending: Family Medicine | Admitting: Family Medicine

## 2017-01-31 ENCOUNTER — Ambulatory Visit (INDEPENDENT_AMBULATORY_CARE_PROVIDER_SITE_OTHER): Payer: Self-pay | Admitting: Family Medicine

## 2017-01-31 ENCOUNTER — Encounter: Payer: Self-pay | Admitting: Family Medicine

## 2017-01-31 DIAGNOSIS — Z0000 Encounter for general adult medical examination without abnormal findings: Secondary | ICD-10-CM | POA: Diagnosis not present

## 2017-01-31 DIAGNOSIS — Z1151 Encounter for screening for human papillomavirus (HPV): Secondary | ICD-10-CM | POA: Diagnosis present

## 2017-01-31 DIAGNOSIS — Z124 Encounter for screening for malignant neoplasm of cervix: Secondary | ICD-10-CM | POA: Insufficient documentation

## 2017-01-31 DIAGNOSIS — Z01419 Encounter for gynecological examination (general) (routine) without abnormal findings: Secondary | ICD-10-CM | POA: Insufficient documentation

## 2017-01-31 DIAGNOSIS — D649 Anemia, unspecified: Secondary | ICD-10-CM | POA: Diagnosis not present

## 2017-01-31 LAB — COMPREHENSIVE METABOLIC PANEL
ALT: 11 U/L (ref 0–35)
AST: 14 U/L (ref 0–37)
Albumin: 4.5 g/dL (ref 3.5–5.2)
Alkaline Phosphatase: 75 U/L (ref 39–117)
BUN: 18 mg/dL (ref 6–23)
CO2: 28 mEq/L (ref 19–32)
Calcium: 10.7 mg/dL — ABNORMAL HIGH (ref 8.4–10.5)
Chloride: 105 mEq/L (ref 96–112)
Creatinine, Ser: 0.8 mg/dL (ref 0.40–1.20)
GFR: 79.32 mL/min (ref >60.00)
GLUCOSE: 95 mg/dL (ref 70–99)
Potassium: 4.4 mEq/L (ref 3.5–5.1)
Sodium: 138 mEq/L (ref 135–145)
TOTAL PROTEIN: 7.3 g/dL (ref 6.0–8.3)
Total Bilirubin: 1.2 mg/dL (ref 0.2–1.2)

## 2017-01-31 LAB — CBC
HCT: 38.3 % (ref 36.0–46.0)
Hemoglobin: 12.6 g/dL (ref 12.0–15.0)
MCHC: 33 g/dL (ref 30.0–36.0)
MCV: 81.6 fl (ref 78.0–100.0)
Platelets: 310 10*3/uL (ref 150.0–400.0)
RBC: 4.69 Mil/uL (ref 3.87–5.11)
RDW: 15.8 % — AB (ref 11.5–15.5)
WBC: 5.8 10*3/uL (ref 4.0–10.5)

## 2017-01-31 NOTE — Patient Instructions (Signed)
Encouraged in Preventive Care 40-64 Years, Female Preventive care refers to lifestyle choices and visits with your health care provider that can promote health and wellness. What does preventive care include?  A yearly physical exam. This is also called an annual well check.  Dental exams once or twice a year.  Routine eye exams. Ask your health care provider how often you should have your eyes checked.  Personal lifestyle choices, including:  Daily care of your teeth and gums.  Regular physical activity.  Eating a healthy diet.  Avoiding tobacco and drug use.  Limiting alcohol use.  Practicing safe sex.  Taking low-dose aspirin daily starting at age 15.  Taking vitamin and mineral supplements as recommended by your health care provider. What happens during an annual well check? The services and screenings done by your health care provider during your annual well check will depend on your age, overall health, lifestyle risk factors, and family history of disease. Counseling  Your health care provider may ask you questions about your:  Alcohol use.  Tobacco use.  Drug use.  Emotional well-being.  Home and relationship well-being.  Sexual activity.  Eating habits.  Work and work Statistician.  Method of birth control.  Menstrual cycle.  Pregnancy history. Screening  You may have the following tests or measurements:  Height, weight, and BMI.  Blood pressure.  Lipid and cholesterol levels. These may be checked every 5 years, or more frequently if you are over 55 years old.  Skin check.  Lung cancer screening. You may have this screening every year starting at age 55 if you have a 30-pack-year history of smoking and currently smoke or have quit within the past 15 years.  Fecal occult blood test (FOBT) of the stool. You may have this test every year starting at age 555.  Flexible sigmoidoscopy or colonoscopy. You may have a sigmoidoscopy every 5 years or a  colonoscopy every 10 years starting at age 555.  Hepatitis C blood test.  Hepatitis B blood test.  Sexually transmitted disease (STD) testing.  Diabetes screening. This is done by checking your blood sugar (glucose) after you have not eaten for a while (fasting). You may have this done every 1-3 years.  Mammogram. This may be done every 1-2 years. Talk to your health care provider about when you should start having regular mammograms. This may depend on whether you have a family history of breast cancer.  BRCA-related cancer screening. This may be done if you have a family history of breast, ovarian, tubal, or peritoneal cancers.  Pelvic exam and Pap test. This may be done every 3 years starting at age 555. Starting at age 54, this may be done every 5 years if you have a Pap test in combination with an HPV test.  Bone density scan. This is done to screen for osteoporosis. You may have this scan if you are at high risk for osteoporosis. Discuss your test results, treatment options, and if necessary, the need for more tests with your health care provider. Vaccines  Your health care provider may recommend certain vaccines, such as:  Influenza vaccine. This is recommended every year.  Tetanus, diphtheria, and acellular pertussis (Tdap, Td) vaccine. You may need a Td booster every 10 years.  Varicella vaccine. You may need this if you have not been vaccinated.  Zoster vaccine. You may need this after age 55.  Measles, mumps, and rubella (MMR) vaccine. You may need at least one dose of MMR if you  were born in [birth year] or later. You may also need a second dose.  Pneumococcal 13-valent conjugate (PCV13) vaccine. You may need this if you have certain conditions and were not previously vaccinated.  Pneumococcal polysaccharide (PPSV23) vaccine. You may need one or two doses if you smoke cigarettes or if you have certain conditions.  Meningococcal vaccine. You may need this if you have certain  conditions.  Hepatitis A vaccine. You may need this if you have certain conditions or if you travel or work in places where you may be exposed to hepatitis A.  Hepatitis B vaccine. You may need this if you have certain conditions or if you travel or work in places where you may be exposed to hepatitis B.  Haemophilus influenzae type b (Hib) vaccine. You may need this if you have certain conditions. Talk to your health care provider about which screenings and vaccines you need and how often you need them. This information is not intended to replace advice given to you by your health care provider. Make sure you discuss any questions you have with your health care provider. Document Released: 01/12/2016 Document Revised: 09/04/2016 Document Reviewed: 10/17/2015 Elsevier Interactive Patient Education  2017 [employer]. reased rest and hydration, add probiotics, zinc such as Coldeze or Xicam. Treat fevers as needed. Vitamin C 500 to 1000 mg, aged and black garlic, [place], Elderberry, Probiotics

## 2017-01-31 NOTE — Assessment & Plan Note (Signed)
Pap today, no concerns on exam.  

## 2017-01-31 NOTE — Progress Notes (Signed)
Patient ID: Kristen Dunlap, female   DOB: 01-08-1961, 56 y.o.   MRN: [id number]   Subjective:    Patient ID: Kristen Dunlap, female    DOB: [birth year], 56 y.o.   MRN: [id number]  Chief Complaint  Patient presents with  . Annual Exam   I acted as a Education administrator for Dr. Charlett Blake. Princess, RMA  HPI  Patient is in today for annual exam. She feels well today. She passed a kidney stone a couple months ago but no urinayr complaints today. Reviewed her last colonoscopy in 2011. Next colonoscopy in 2021. No GI concerns. Is trying to stay active and maintain a heart healthy. Denies CP/palp/SOB/HA/congestion/fevers/GI or GU c/o. Taking meds as prescribed. Denies any GYN concerns.   Past Medical History:  Diagnosis Date  . Anemia 11/09/2015  . Fever blister 03/09/2013  . Heel spur    right  . Other and unspecified hyperlipidemia 03/14/2013  . Overweight(278.02)   . Personal history of kidney stones   . Postmenopausal   . Postmenopausal bleeding 10/01/2011    Past Surgical History:  Procedure Laterality Date  . [place]   X 2  . [place]  . HYSTEROSCOPY  11/29/2011   Procedure: HYSTEROSCOPY;  Surgeon: Terrance Mass, MD;  Location: [facility];  Service: Gynecology;  Laterality: N/A;  with Myosure  . TONSILLECTOMY  AS CHILD  . TUBAL LIGATION  15 YRS AGO    Family History  Problem Relation Age of Onset  . Diabetes Father   . Cancer Father     throat ca/ smoker  . Dementia Father   . Cancer Paternal Grandmother     esophagus, cancer  . Other Sister 43    PLS  . Early death Sister   . Colon cancer Neg Hx     Social History   Social History  . Marital status: Married    Spouse name: N/A  . Number of children: N/A  . Years of education: N/A   Occupational History  . Not on file.   Social History Main Topics  . Smoking status: Never Smoker  . Smokeless tobacco: Never Used  . Alcohol use Yes     Comment: occasionaly  . Drug  use: No  . Sexual activity: Yes    Partners: Male     Comment: tubes tied, lives with husband, works in office setting, accounting, no dietary restrictions   Other Topics Concern  . Not on file   Social History Narrative  . No narrative on file    Outpatient Medications Prior to Visit  Medication Sig Dispense Refill  . Ascorbic Acid (VITAMIN C) 500 MG tablet Take 500 mg by mouth daily.     . Polyethylene Glycol 3350 (MIRALAX PO) Take by mouth.    . Probiotic Product (PROBIOTIC DAILY) CAPS Take 1 capsule by mouth every other day.     No facility-administered medications prior to visit.     Allergies  Allergen Reactions  . Chlorhexidine Rash    Review of Systems  Constitutional: Negative for fever and malaise/fatigue.  HENT: Negative for congestion.   Eyes: Negative for blurred vision.  Respiratory: Negative for cough and shortness of breath.   Cardiovascular: Negative for chest pain, palpitations and leg swelling.  Gastrointestinal: Negative for vomiting.  Musculoskeletal: Negative for back pain.  Skin: Negative for rash.  Neurological: Negative for loss of consciousness and headaches.       Objective:  Physical Exam  Constitutional: She is oriented to person, place, and time. She appears well-developed and well-nourished. No distress.  HENT:  Head: Normocephalic and atraumatic.  Eyes: Conjunctivae are normal.  Neck: Normal range of motion. No thyromegaly present.  Cardiovascular: Normal rate and regular rhythm.   Pulmonary/Chest: Effort normal and breath sounds normal. She has no wheezes.  Abdominal: Soft. Bowel sounds are normal. There is no tenderness.  Genitourinary: Vagina normal and uterus normal. No vaginal discharge found.  Musculoskeletal: Normal range of motion. She exhibits no edema or deformity.  Neurological: She is alert and oriented to person, place, and time.  Skin: Skin is warm and dry. She is not diaphoretic.  Psychiatric: She has a normal mood  and affect.    There were no vitals taken for this visit. Wt Readings from Last 3 Encounters:  10/27/15 168 lb 6 oz (76.4 kg)  04/17/15 187 lb (84.8 kg)  04/04/15 187 lb (84.8 kg)     Lab Results  Component Value Date   WBC 5.8 01/31/2017   HGB 12.6 01/31/2017   HCT 38.3 01/31/2017   PLT 310.0 01/31/2017   GLUCOSE 95 01/31/2017   CHOL 197 10/27/2015   TRIG 46.0 10/27/2015   HDL 68.80 10/27/2015   LDLDIRECT 115.0 03/25/2011   LDLCALC 119 (H) 10/27/2015   ALT 11 01/31/2017   AST 14 01/31/2017   NA 138 01/31/2017   K 4.4 01/31/2017   CL 105 01/31/2017   CREATININE 0.80 01/31/2017   BUN 18 01/31/2017   CO2 28 01/31/2017   TSH 2.42 10/27/2015   INR 0.87 09/30/2011    Lab Results  Component Value Date   TSH 2.42 10/27/2015   Lab Results  Component Value Date   WBC 5.8 01/31/2017   HGB 12.6 01/31/2017   HCT 38.3 01/31/2017   MCV 81.6 01/31/2017   PLT 310.0 01/31/2017   Lab Results  Component Value Date   NA 138 01/31/2017   K 4.4 01/31/2017   CO2 28 01/31/2017   GLUCOSE 95 01/31/2017   BUN 18 01/31/2017   CREATININE 0.80 01/31/2017   BILITOT 1.2 01/31/2017   ALKPHOS 75 01/31/2017   AST 14 01/31/2017   ALT 11 01/31/2017   PROT 7.3 01/31/2017   ALBUMIN 4.5 01/31/2017   CALCIUM 10.7 (H) 01/31/2017   GFR 79.32 01/31/2017   Lab Results  Component Value Date   CHOL 197 10/27/2015   Lab Results  Component Value Date   HDL 68.80 10/27/2015   Lab Results  Component Value Date   LDLCALC 119 (H) 10/27/2015   Lab Results  Component Value Date   TRIG 46.0 10/27/2015   Lab Results  Component Value Date   CHOLHDL 3 10/27/2015   No results found for: HGBA1C     Assessment & Plan:   Problem List Items Addressed This Visit    Preventative health care - Primary    Patient encouraged to maintain heart healthy diet, regular exercise, adequate sleep. Consider daily probiotics. Take medications as prescribed      Relevant Orders   CBC (Completed)    Comprehensive metabolic panel (Completed)   Anemia    Mild, resolved with CBC today.      Relevant Orders   CBC (Completed)   Cervical cancer screening    Pap today, no concerns on exam.       Relevant Orders   Cytology - PAP      I am having Ms. Rozsa maintain her vitamin C, Polyethylene  Glycol 3350 (MIRALAX PO), and PROBIOTIC DAILY.  No orders of the defined types were placed in this encounter.   CMA served as Education administrator during this visit. History, Physical and Plan performed by medical provider. Documentation and orders reviewed and attested to.  Penni Homans, MD

## 2017-01-31 NOTE — Progress Notes (Signed)
Pre visit review using our clinic review tool, if applicable. No additional management support is needed unless otherwise documented below in the visit note. 

## 2017-02-02 NOTE — Assessment & Plan Note (Signed)
Encouraged heart healthy diet, increase exercise, avoid trans fats, consider a krill oil cap daily 

## 2017-02-02 NOTE — Assessment & Plan Note (Signed)
Patient encouraged to maintain heart healthy diet, regular exercise, adequate sleep. Consider daily probiotics. Take medications as prescribed 

## 2017-02-02 NOTE — Assessment & Plan Note (Signed)
Mild, resolved with CBC today.

## 2017-02-03 ENCOUNTER — Other Ambulatory Visit: Payer: Self-pay | Admitting: Family Medicine

## 2017-02-04 LAB — CYTOLOGY - PAP
Diagnosis: NEGATIVE
HPV: NOT DETECTED

## 2017-03-03 ENCOUNTER — Other Ambulatory Visit: Payer: BLUE CROSS/BLUE SHIELD

## 2017-06-05 LAB — HM MAMMOGRAPHY

## 2017-06-10 ENCOUNTER — Encounter: Payer: Self-pay | Admitting: Family Medicine

## 2017-08-02 ENCOUNTER — Encounter (HOSPITAL_COMMUNITY): Payer: Self-pay | Admitting: Emergency Medicine

## 2017-08-02 ENCOUNTER — Emergency Department (HOSPITAL_COMMUNITY): Payer: BLUE CROSS/BLUE SHIELD

## 2017-08-02 ENCOUNTER — Observation Stay (HOSPITAL_COMMUNITY)
Admission: EM | Admit: 2017-08-02 | Discharge: 2017-08-03 | Disposition: A | Discharge status: Home / Self Care | Payer: BLUE CROSS/BLUE SHIELD | Attending: General Surgery | Admitting: General Surgery

## 2017-08-02 DIAGNOSIS — W19XXXA Unspecified fall, initial encounter: Secondary | ICD-10-CM

## 2017-08-02 DIAGNOSIS — A419 Sepsis, unspecified organism: Secondary | ICD-10-CM | POA: Insufficient documentation

## 2017-08-02 DIAGNOSIS — E860 Dehydration: Secondary | ICD-10-CM | POA: Insufficient documentation

## 2017-08-02 DIAGNOSIS — S270XXA Traumatic pneumothorax, initial encounter: Secondary | ICD-10-CM | POA: Diagnosis not present

## 2017-08-02 DIAGNOSIS — J189 Pneumonia, unspecified organism: Secondary | ICD-10-CM | POA: Insufficient documentation

## 2017-08-02 DIAGNOSIS — R55 Syncope and collapse: Secondary | ICD-10-CM | POA: Insufficient documentation

## 2017-08-02 DIAGNOSIS — N179 Acute kidney failure, unspecified: Secondary | ICD-10-CM | POA: Insufficient documentation

## 2017-08-02 DIAGNOSIS — J939 Pneumothorax, unspecified: Secondary | ICD-10-CM

## 2017-08-02 DIAGNOSIS — W11XXXA Fall on and from ladder, initial encounter: Secondary | ICD-10-CM | POA: Insufficient documentation

## 2017-08-02 DIAGNOSIS — I4891 Unspecified atrial fibrillation: Secondary | ICD-10-CM | POA: Insufficient documentation

## 2017-08-02 DIAGNOSIS — R1110 Vomiting, unspecified: Secondary | ICD-10-CM | POA: Insufficient documentation

## 2017-08-02 DIAGNOSIS — S2241XA Multiple fractures of ribs, right side, initial encounter for closed fracture: Secondary | ICD-10-CM | POA: Diagnosis not present

## 2017-08-02 DIAGNOSIS — S2239XA Fracture of one rib, unspecified side, initial encounter for closed fracture: Secondary | ICD-10-CM | POA: Diagnosis present

## 2017-08-02 DIAGNOSIS — R197 Diarrhea, unspecified: Secondary | ICD-10-CM

## 2017-08-02 DIAGNOSIS — S2249XA Multiple fractures of ribs, unspecified side, initial encounter for closed fracture: Secondary | ICD-10-CM | POA: Diagnosis present

## 2017-08-02 LAB — COMPREHENSIVE METABOLIC PANEL
ALBUMIN: 4.1 g/dL (ref 3.5–5.0)
ALT: 30 U/L (ref 14–54)
AST: 54 U/L — ABNORMAL HIGH (ref 15–41)
Alkaline Phosphatase: 80 U/L (ref 38–126)
Anion gap: 6 (ref 5–15)
BUN: 19 mg/dL (ref 6–20)
CHLORIDE: 112 mmol/L — AB (ref 101–111)
CO2: 25 mmol/L (ref 22–32)
CREATININE: 1.3 mg/dL — AB (ref 0.44–1.00)
Calcium: 10.4 mg/dL — ABNORMAL HIGH (ref 8.9–10.3)
GFR calc Af Amer: 53 mL/min — ABNORMAL LOW (ref >60)
GFR calc non Af Amer: 46 mL/min — ABNORMAL LOW (ref >60)
GLUCOSE: 113 mg/dL — AB (ref 65–99)
POTASSIUM: 4.3 mmol/L (ref 3.5–5.1)
Sodium: 143 mmol/L (ref 135–145)
Total Bilirubin: 0.9 mg/dL (ref 0.3–1.2)
Total Protein: 6.5 g/dL (ref 6.5–8.1)

## 2017-08-02 LAB — CBC WITH DIFFERENTIAL/PLATELET
Basophils Absolute: 0 10*3/uL (ref 0.0–0.1)
Basophils Relative: 0 %
EOS PCT: 0 %
Eosinophils Absolute: 0 10*3/uL (ref 0.0–0.7)
HEMATOCRIT: 39.4 % (ref 36.0–46.0)
Hemoglobin: 13.1 g/dL (ref 12.0–15.0)
LYMPHS PCT: 11 %
Lymphs Abs: 1.7 10*3/uL (ref 0.7–4.0)
MCH: 27.8 pg (ref 26.0–34.0)
MCHC: 33.2 g/dL (ref 30.0–36.0)
MCV: 83.5 fL (ref 78.0–100.0)
MONO ABS: 1 10*3/uL (ref 0.1–1.0)
Monocytes Relative: 7 %
NEUTROS ABS: 12 10*3/uL — AB (ref 1.7–7.7)
Neutrophils Relative %: 82 %
PLATELETS: 260 10*3/uL (ref 150–400)
RBC: 4.72 MIL/uL (ref 3.87–5.11)
RDW: 15.3 % (ref 11.5–15.5)
WBC: 14.7 10*3/uL — AB (ref 4.0–10.5)

## 2017-08-02 MED ORDER — OXYCODONE HCL 5 MG PO TABS
5.0000 mg | ORAL_TABLET | ORAL | Status: DC | PRN
  Administered 2017-08-03 (×2): 5 mg via ORAL
  Filled 2017-08-02 (×2): qty 1

## 2017-08-02 MED ORDER — NAPROXEN 250 MG PO TABS
250.0000 mg | ORAL_TABLET | Freq: Two times a day (BID) | ORAL | Status: DC
  Administered 2017-08-03: 250 mg via ORAL
  Filled 2017-08-02: qty 1

## 2017-08-02 MED ORDER — SODIUM CHLORIDE 0.9 % IV BOLUS (SEPSIS)
1000.0000 mL | Freq: Once | INTRAVENOUS | Status: AC
  Administered 2017-08-02: 1000 mL via INTRAVENOUS

## 2017-08-02 MED ORDER — ONDANSETRON HCL 4 MG/2ML IJ SOLN: 4.0000 mg | Freq: Four times a day (QID) | INTRAMUSCULAR | Status: DC | PRN

## 2017-08-02 MED ORDER — OXYCODONE HCL 5 MG PO TABS
10.0000 mg | ORAL_TABLET | Freq: Once | ORAL | Status: AC
  Administered 2017-08-02: 10 mg via ORAL
  Filled 2017-08-02: qty 2

## 2017-08-02 MED ORDER — ACETAMINOPHEN 500 MG PO TABS
1000.0000 mg | ORAL_TABLET | Freq: Once | ORAL | Status: AC
  Administered 2017-08-02: 1000 mg via ORAL
  Filled 2017-08-02: qty 2

## 2017-08-02 MED ORDER — ACETAMINOPHEN 325 MG PO TABS: 650.0000 mg | ORAL_TABLET | ORAL | Status: DC | PRN

## 2017-08-02 MED ORDER — SODIUM CHLORIDE 0.9 % IV SOLN
INTRAVENOUS | Status: DC
  Administered 2017-08-02: 21:00:00 via INTRAVENOUS

## 2017-08-02 MED ORDER — MORPHINE SULFATE (PF) 4 MG/ML IV SOLN: 2.0000 mg | INTRAVENOUS | Status: DC | PRN

## 2017-08-02 MED ORDER — OXYCODONE HCL 5 MG PO TABS: 10.0000 mg | ORAL_TABLET | ORAL | Status: DC | PRN

## 2017-08-02 MED ORDER — MORPHINE SULFATE (PF) 4 MG/ML IV SOLN
4.0000 mg | Freq: Once | INTRAVENOUS | Status: AC
  Administered 2017-08-02: 4 mg via INTRAVENOUS
  Filled 2017-08-02: qty 1

## 2017-08-02 MED ORDER — IOPAMIDOL (ISOVUE-300) INJECTION 61%
INTRAVENOUS | Status: AC
  Administered 2017-08-02: 75 mL
  Filled 2017-08-02: qty 75

## 2017-08-02 MED ORDER — ENOXAPARIN SODIUM 40 MG/0.4ML ~~LOC~~ SOLN
40.0000 mg | SUBCUTANEOUS | Status: DC
  Administered 2017-08-03: 40 mg via SUBCUTANEOUS
  Filled 2017-08-02: qty 0.4

## 2017-08-02 MED ORDER — ONDANSETRON 4 MG PO TBDP
4.0000 mg | ORAL_TABLET | Freq: Four times a day (QID) | ORAL | Status: DC | PRN
Start: 2017-08-02 — End: 2017-08-03

## 2017-08-02 NOTE — Progress Notes (Addendum)
Received patient from [facility] accompanied by father and mother. Patient AOx4, ambulatory, VS stable, O2Sat at 100% on RA and pain at 4/10 at right rib cage but refused PRN pain medication at this time. Oriented patient to room, bed control and call light.  Patient now resting on bed and wanted to sleep after parents left [facility]. Will monitor.

## 2017-08-02 NOTE — ED Provider Notes (Signed)
[facility] DEPT Provider Note   CSN: [id number] Arrival date & time: 08/02/17  1516     History   Chief Complaint Chief Complaint  Patient presents with  . Fall  . Loss of Consciousness    HPI Kristen Dunlap is a 56 y.o. female.  HPI   56 year old female presents with concern for syncope and fall from ladder. Patient reports that she was outside, working in the heat, began to feel lightheaded as she is going up and down the ladder, working on the roof, felt lightheaded, and fell approximately 8 feet. Reports that she fell onto the plywood of the second floor. Had brief loss of consciousness, no seizure-like activity per her family has witnesses. Denied preceding chest pain, shortness of breath. Has not had nausea, vomiting, black or bloody stools, fevers or infectious symptoms. Reports she's had 2 prior syncopal episodes in the distant past one associated with drinking. Presents that she thinks that she was dehydrated and overheated. After the fall, she reports right sided back and side pain. Reports pain with deep breaths and movement. Denies headache, neck pain, numbness, weakness, chest pain, abdominal pain, midline back pain.  Past Medical History:  Diagnosis Date  . Anemia 11/09/2015  . Fever blister 03/09/2013  . Heel spur    right  . Other and unspecified hyperlipidemia 03/14/2013  . Overweight(278.02)   . Personal history of kidney stones   . Postmenopausal   . Postmenopausal bleeding 10/01/2011    Patient Active Problem List   Diagnosis Date Noted  . Vomiting and diarrhea 08/02/2017  . Atrial fibrillation with RVR ([place]) 08/02/2017  . AKI (acute kidney injury) ([place]) 08/02/2017  . Sepsis ([place]) 08/02/2017  . CAP (community acquired pneumonia) 08/02/2017  . Rib fractures 08/02/2017  . Cervical cancer screening 01/31/2017  . Anemia 11/09/2015  . Other and unspecified hyperlipidemia 03/14/2013  . Fever blister 03/09/2013  . Endometrial polyp 11/01/2011  .  Postmenopausal bleeding 10/01/2011  . Preventative health care 03/25/2011  . Personal history of kidney stones   . Heel spur   . Overweight   . Postmenopausal     Past Surgical History:  Procedure Laterality Date  . [place]   X 2  . [place]  . HYSTEROSCOPY  11/29/2011   Procedure: HYSTEROSCOPY;  Surgeon: Terrance Mass, MD;  Location: [facility];  Service: Gynecology;  Laterality: N/A;  with Myosure  . TONSILLECTOMY  AS CHILD  . TUBAL LIGATION  15 YRS AGO    OB History    Gravida Para Term Preterm AB Living   2 2       2    SAB TAB Ectopic Multiple Live Births                   Home Medications    Prior to Admission medications   Medication Sig Start Date End Date Taking? Authorizing Provider  Multiple Vitamin (MULTIVITAMIN WITH MINERALS) TABS tablet Take 1 tablet by mouth daily.   Yes [provider]  polyethylene glycol (MIRALAX / GLYCOLAX) packet Take 17 g by mouth daily. Mix in 8 oz liquid and drink   Yes [provider]    Family History Family History  Problem Relation Age of Onset  . Diabetes Father   . Cancer Father        throat ca/ smoker  . Dementia Father   . Cancer Paternal Grandmother  esophagus, cancer  . Other Sister 39       PLS  . Early death Sister   . Colon cancer Neg Hx     Social History Social History  Substance Use Topics  . Smoking status: Never Smoker  . Smokeless tobacco: Never Used  . Alcohol use Yes     Comment: occasionaly     Allergies   Chlorhexidine   Review of Systems Review of Systems  Constitutional: Negative for fever.  HENT: Negative for sore throat.   Eyes: Negative for visual disturbance.  Respiratory: Negative for cough and shortness of breath.   Cardiovascular: Positive for chest pain (right side).  Gastrointestinal: Negative for abdominal pain, blood in stool, diarrhea, nausea and vomiting.  Genitourinary: Positive for  flank pain. Negative for difficulty urinating.  Musculoskeletal: Negative for back pain, neck pain and neck stiffness.  Skin: Negative for rash.  Neurological: Negative for syncope, weakness, numbness and headaches.     Physical Exam Updated Vital Signs BP 116/76 (BP Location: Right Arm)   Pulse 95   Temp 98.4 F (36.9 C) (Oral)   Resp 19   SpO2 100%   Physical Exam  Constitutional: She is oriented to person, place, and time. She appears well-developed and well-nourished. No distress.  HENT:  Head: Normocephalic and atraumatic.  Eyes: Conjunctivae and EOM are normal.  Neck: Normal range of motion.  Cardiovascular: Normal rate, regular rhythm, normal heart sounds and intact distal pulses.  Exam reveals no gallop and no friction rub.   No murmur heard. Pulmonary/Chest: Effort normal and breath sounds normal. No respiratory distress. She has no wheezes. She has no rales.  Abdominal: Soft. She exhibits no distension. There is no tenderness. There is no guarding.  Right flank tenderness, lateral chest tenderness  Musculoskeletal: She exhibits no edema or tenderness.       Cervical back: She exhibits no bony tenderness.       Thoracic back: She exhibits no bony tenderness.       Lumbar back: She exhibits no bony tenderness.  Neurological: She is alert and oriented to person, place, and time. She has normal strength. No sensory deficit. GCS eye subscore is 4. GCS verbal subscore is 5. GCS motor subscore is 6.  Skin: Skin is warm and dry. No rash noted. She is not diaphoretic. No erythema.  Nursing note and vitals reviewed.    ED Treatments / Results  Labs (all labs ordered are listed, but only abnormal results are displayed) Labs Reviewed  CBC WITH DIFFERENTIAL/PLATELET - Abnormal; Notable for the following:       Result Value   WBC 14.7 (*)    Neutro Abs 12.0 (*)    All other components within normal limits  COMPREHENSIVE METABOLIC PANEL - Abnormal; Notable for the following:     Chloride 112 (*)    Glucose, Bld 113 (*)    Creatinine, Ser 1.30 (*)    Calcium 10.4 (*)    AST 54 (*)    GFR calc non Af Amer 46 (*)    GFR calc Af Amer 53 (*)    All other components within normal limits    EKG  EKG Interpretation  Date/Time:  Saturday August 02 2017 15:27:05 EDT Ventricular Rate:  75 PR Interval:    QRS Duration: 106 QT Interval:  387 QTC Calculation: 433 R Axis:   16 Text Interpretation:  Sinus rhythm Prolonged PR interval No previous ECGs available Confirmed by Gareth Morgan [phone]) on 08/02/2017 3:40:05  PM Also confirmed by Gareth Morgan [phone]), editor Laurena Spies [phone])  on 08/02/2017 3:48:06 PM       Radiology Dg Chest 2 View  Result Date: 08/02/2017 CLINICAL DATA:  Syncope and fall from a ladder today. Initial encounter. EXAM: CHEST  2 VIEW COMPARISON:  None. FINDINGS: Lung volumes are low with basilar atelectasis. No pneumothorax or pleural effusion. Heart size is upper normal. There are acute fractures of the right fifth, sixth, seventh and eighth ribs. No other fracture is identified. IMPRESSION: Right fifth through eighth rib fractures. Negative for pneumothorax. Bibasilar subsegmental atelectasis. Electronically Signed   By: Inge Rise M.D.   On: 08/02/2017 16:59   Ct Chest W Contrast  Result Date: 08/02/2017 CLINICAL DATA:  56 year old female with trauma and suspected right rib fractures. EXAM: CT CHEST WITH CONTRAST TECHNIQUE: Multidetector CT imaging of the chest was performed during intravenous contrast administration. CONTRAST:  74mL ISOVUE-300 IOPAMIDOL (ISOVUE-300) INJECTION 61% COMPARISON:  Chest radiograph dated 08/02/2017 FINDINGS: Cardiovascular: There is mild cardiomegaly. No pericardial effusion. The thoracic aorta is unremarkable. The origins of the great vessels of the aortic arch are patent. The central pulmonary artery is is are unremarkable. Mediastinum/Nodes: There is no hilar or mediastinal adenopathy. The esophagus  and thyroid gland are grossly unremarkable. No mediastinal fluid collection or hematoma. Lungs/Pleura: Bibasilar linear and streaky densities most consistent with atelectatic changes/scarring. A more confluent area of density at the right lung base may represent pulmonary contusion. Pneumonia is not excluded. Clinical correlation is recommended. There is small amount of fluid along the right fissure. There is a small right pneumothorax measuring up to 4 mm in thickness. Minimal left apical pneumothorax is suspected (series 4 image 18 and coronal series 6, image 57). The central airways are patent. Upper Abdomen: No acute abnormality. Musculoskeletal: Nondisplaced fractures of the right posterior sixth-ninth ribs. Small pockets of soft tissue gas external to the right rib cage. No large hematoma. IMPRESSION: 1. Multiple nondisplaced right rib fractures with a small right pneumothorax. A tiny left apical pneumothorax is also suspected. 2. Bibasilar atelectasis/scarring as well as possible area contusion in the right lung base. These results were called by telephone at the time of interpretation on 08/02/2017 at 7:06 pm to Dr. Gareth Morgan , who verbally acknowledged these results. Electronically Signed   By: Anner Crete M.D.   On: 08/02/2017 19:14    Procedures Procedures (including critical care time)  Medications Ordered in ED Medications  enoxaparin (LOVENOX) injection 40 mg (not administered)  0.9 %  sodium chloride infusion ( Intravenous New Bag/Given 08/02/17 2113)  acetaminophen (TYLENOL) tablet 650 mg (not administered)  oxyCODONE (Oxy IR/ROXICODONE) immediate release tablet 5 mg (not administered)  oxyCODONE (Oxy IR/ROXICODONE) immediate release tablet 10 mg (not administered)  morphine 4 MG/ML injection 2 mg (not administered)  ondansetron (ZOFRAN-ODT) disintegrating tablet 4 mg (not administered)    Or  ondansetron (ZOFRAN) injection 4 mg (not administered)  naproxen (NAPROSYN) tablet  250 mg (not administered)  sodium chloride 0.9 % bolus 1,000 mL (0 mLs Intravenous Stopped 08/02/17 1729)  morphine 4 MG/ML injection 4 mg (4 mg Intravenous Given 08/02/17 1729)  oxyCODONE (Oxy IR/ROXICODONE) immediate release tablet 10 mg (10 mg Oral Given 08/02/17 1803)  acetaminophen (TYLENOL) tablet 1,000 mg (1,000 mg Oral Given 08/02/17 1803)  iopamidol (ISOVUE-300) 61 % injection (75 mLs  Contrast Given 08/02/17 1847)     Initial Impression / Assessment and Plan / ED Course  I have reviewed the triage vital signs and  the nursing notes.  Pertinent labs & imaging results that were available during my care of the patient were reviewed by me and considered in my medical decision making (see chart for details).     56 year old female with a history of hyperlipidemia presents with concern for syncope and fall 8 feet off of a ladder.   EKG evaluated by me and shows sinus rhythm with no sign of prolonged QTc, no brugada, no sign of HOCM, no ST abnormalities. Hemoglobin wnl, electrolytes are wnl. No chest pain or shortness of breath, doubt PE/ACS. Suspect most likely etiology of syncope was dehydration from working in the heat.  From fall, denies headache, neck pain. CSpine cleared by NEXUS. Doubt acute intracranial injury given no headache, no n/v, no sign of skull fracture.  No symptoms to suggest intraabdominal or pelvic etiology.  CXR shows rib fx 5-8. CT chest shows multiple nondisplaced fractures and small right sided pneumothorax.  Dr. Donne Hazel of trauma surgery to admit for further care.   Final Clinical Impressions(s) / ED Diagnoses   Final diagnoses:  Closed fracture of multiple ribs of right side, initial encounter  Traumatic pneumothorax, initial encounter  Syncope, unspecified syncope type  Dehydration  Fall, initial encounter    New Prescriptions Current Discharge Medication List       Gareth Morgan, MD 08/03/17 [phone]

## 2017-08-02 NOTE — ED Triage Notes (Signed)
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 Pt c/o pain to right upper/mid back that increases with inspiration. Pt given 100 mcg of Fentanyl by EMS.

## 2017-08-02 NOTE — ED Notes (Signed)
Attempted report x1. 

## 2017-08-02 NOTE — H&P (Signed)
Kristen Dunlap is an 56 y.o. female.   Chief Complaint: rib fx, small ptx HPI: 35 yof who fell from ladder after syncopal episode (she states was hot and dehydrated).  This was about 8 feet.  She has right chest pain. No other complaints.  She has cxr with right rib fx and a ct with small ptx on right.  There is some what appears to be contusion in the right base.   Past Medical History:  Diagnosis Date  . Anemia 11/09/2015  . Fever blister 03/09/2013  . Heel spur    right  . Other and unspecified hyperlipidemia 03/14/2013  . Overweight(278.02)   . Personal history of kidney stones   . Postmenopausal   . Postmenopausal bleeding 10/01/2011    Past Surgical History:  Procedure Laterality Date  . [place]   X 2  . [place]  . HYSTEROSCOPY  11/29/2011   Procedure: HYSTEROSCOPY;  Surgeon: Terrance Mass, MD;  Location: [facility];  Service: Gynecology;  Laterality: N/A;  with Myosure  . TONSILLECTOMY  AS CHILD  . TUBAL LIGATION  15 YRS AGO    Family History  Problem Relation Age of Onset  . Diabetes Father   . Cancer Father        throat ca/ smoker  . Dementia Father   . Cancer Paternal Grandmother        esophagus, cancer  . Other Sister 1       PLS  . Early death Sister   . Colon cancer Neg Hx    Social History:  reports that she has never smoked. She has never used smokeless tobacco. She reports that she drinks alcohol. She reports that she does not use drugs.  Allergies:  Allergies  Allergen Reactions  . Chlorhexidine Rash    Meds: none  Results for orders placed or performed during the hospital encounter of 08/02/17 (from the past 48 hour(s))  CBC with Differential     Status: Abnormal   Collection Time: 08/02/17  3:52 PM  Result Value Ref Range   WBC 14.7 (H) 4.0 - 10.5 K/uL   RBC 4.72 3.87 - 5.11 MIL/uL   Hemoglobin 13.1 12.0 - 15.0 g/dL   HCT 39.4 36.0 - 46.0 %   MCV 83.5 78.0 - 100.0 fL   MCH 27.8  26.0 - 34.0 pg   MCHC 33.2 30.0 - 36.0 g/dL   RDW 15.3 11.5 - 15.5 %   Platelets 260 150 - 400 K/uL   Neutrophils Relative % 82 %   Neutro Abs 12.0 (H) 1.7 - 7.7 K/uL   Lymphocytes Relative 11 %   Lymphs Abs 1.7 0.7 - 4.0 K/uL   Monocytes Relative 7 %   Monocytes Absolute 1.0 0.1 - 1.0 K/uL   Eosinophils Relative 0 %   Eosinophils Absolute 0.0 0.0 - 0.7 K/uL   Basophils Relative 0 %   Basophils Absolute 0.0 0.0 - 0.1 K/uL  Comprehensive metabolic panel     Status: Abnormal   Collection Time: 08/02/17  3:52 PM  Result Value Ref Range   Sodium 143 135 - 145 mmol/L   Potassium 4.3 3.5 - 5.1 mmol/L   Chloride 112 (H) 101 - 111 mmol/L   CO2 25 22 - 32 mmol/L   Glucose, Bld 113 (H) 65 - 99 mg/dL   BUN 19 6 - 20 mg/dL   Creatinine, Ser 1.30 (H) 0.44 - 1.00 mg/dL  Calcium 10.4 (H) 8.9 - 10.3 mg/dL   Total Protein 6.5 6.5 - 8.1 g/dL   Albumin 4.1 3.5 - 5.0 g/dL   AST 54 (H) 15 - 41 U/L   ALT 30 14 - 54 U/L   Alkaline Phosphatase 80 38 - 126 U/L   Total Bilirubin 0.9 0.3 - 1.2 mg/dL   GFR calc non Af Amer 46 (L) >60 mL/min   GFR calc Af Amer 53 (L) >60 mL/min    Comment: (NOTE) The eGFR has been calculated using the CKD EPI equation. This calculation has not been validated in all clinical situations. eGFR's persistently <60 mL/min signify possible Chronic Kidney Disease.    Anion gap 6 5 - 15   Dg Chest 2 View  Result Date: 08/02/2017 CLINICAL DATA:  Syncope and fall from a ladder today. Initial encounter. EXAM: CHEST  2 VIEW COMPARISON:  None. FINDINGS: Lung volumes are low with basilar atelectasis. No pneumothorax or pleural effusion. Heart size is upper normal. There are acute fractures of the right fifth, sixth, seventh and eighth ribs. No other fracture is identified. IMPRESSION: Right fifth through eighth rib fractures. Negative for pneumothorax. Bibasilar subsegmental atelectasis. Electronically Signed   By: Inge Rise M.D.   On: 08/02/2017 16:59   Ct Chest W  Contrast  Result Date: 08/02/2017 CLINICAL DATA:  56 year old female with trauma and suspected right rib fractures. EXAM: CT CHEST WITH CONTRAST TECHNIQUE: Multidetector CT imaging of the chest was performed during intravenous contrast administration. CONTRAST:  68m ISOVUE-300 IOPAMIDOL (ISOVUE-300) INJECTION 61% COMPARISON:  Chest radiograph dated 08/02/2017 FINDINGS: Cardiovascular: There is mild cardiomegaly. No pericardial effusion. The thoracic aorta is unremarkable. The origins of the great vessels of the aortic arch are patent. The central pulmonary artery is is are unremarkable. Mediastinum/Nodes: There is no hilar or mediastinal adenopathy. The esophagus and thyroid gland are grossly unremarkable. No mediastinal fluid collection or hematoma. Lungs/Pleura: Bibasilar linear and streaky densities most consistent with atelectatic changes/scarring. A more confluent area of density at the right lung base may represent pulmonary contusion. Pneumonia is not excluded. Clinical correlation is recommended. There is small amount of fluid along the right fissure. There is a small right pneumothorax measuring up to 4 mm in thickness. Minimal left apical pneumothorax is suspected (series 4 image 18 and coronal series 6, image 57). The central airways are patent. Upper Abdomen: No acute abnormality. Musculoskeletal: Nondisplaced fractures of the right posterior sixth-ninth ribs. Small pockets of soft tissue gas external to the right rib cage. No large hematoma. IMPRESSION: 1. Multiple nondisplaced right rib fractures with a small right pneumothorax. A tiny left apical pneumothorax is also suspected. 2. Bibasilar atelectasis/scarring as well as possible area contusion in the right lung base. These results were called by telephone at the time of interpretation on 08/02/2017 at 7:06 pm to Dr. EGareth Morgan, who verbally acknowledged these results. Electronically Signed   By: AAnner CreteM.D.   On: 08/02/2017 19:14     Review of Systems  Cardiovascular: Positive for chest pain (right lateral chest).  All other systems reviewed and are negative.   Blood pressure 117/80, pulse 78, temperature 98.1 F (36.7 C), temperature source Oral, resp. rate 18, SpO2 97 %. Physical Exam  Vitals reviewed. Constitutional: She is oriented to person, place, and time. She appears well-developed and well-nourished.  HENT:  Head: Normocephalic and atraumatic.  Right Ear: External ear normal.  Left Ear: External ear normal.  Mouth/Throat: Oropharynx is clear and moist.  Eyes:  Pupils are equal, round, and reactive to light. No scleral icterus.  Neck: Neck supple.  Cardiovascular: Normal rate, regular rhythm, normal heart sounds and intact distal pulses.   Respiratory: Effort normal and breath sounds normal. She has no wheezes. She has no rales. She exhibits tenderness (right latearl).  GI: Soft. There is no tenderness.  Musculoskeletal: Normal range of motion. She exhibits no edema or tenderness.  Lymphadenopathy:    She has no cervical adenopathy.  Neurological: She is alert and oriented to person, place, and time.  Skin: Skin is warm and dry.     Assessment/Plan Rib fractures Small right ptx  Admission, pulm toilet, pain control, repeat xray in am  Nuala Chiles, MD 08/02/2017, 7:24 PM

## 2017-08-03 ENCOUNTER — Observation Stay (HOSPITAL_COMMUNITY): Payer: BLUE CROSS/BLUE SHIELD

## 2017-08-03 MED ORDER — OXYCODONE HCL 5 MG PO TABS: 5.0000 mg | ORAL_TABLET | Freq: Four times a day (QID) | ORAL | 0 refills | Status: DC | PRN

## 2017-08-03 MED ORDER — ACETAMINOPHEN 325 MG PO TABS: 650.0000 mg | ORAL_TABLET | ORAL | Status: DC | PRN

## 2017-08-03 MED ORDER — NAPROXEN 250 MG PO TABS: 250.0000 mg | ORAL_TABLET | Freq: Two times a day (BID) | ORAL | Status: DC

## 2017-08-03 NOTE — Progress Notes (Signed)
Discharge instructions gone over with patient and son. Home medications discussed. Prescription given. Follow up appointment to be made. Diet, activity, and bowel regimen discussed. Signs of worsening condition discussed. Reasons to call the doctor gone over. Patient verbalized understanding of instructions.

## 2017-08-03 NOTE — Discharge Summary (Signed)
[facility] Discharge Summary   Patient ID: Kristen Dunlap MRN: [id number] DOB/AGE: 02-02-1961 56 y.o.  Admit date: 08/02/2017 Discharge date: 08/03/2017  Admitting Diagnosis: Fall  Multiple right rib fractures Apical pneumothorax  Discharge Diagnosis Patient Active Problem List   Diagnosis Date Noted  . Vomiting and diarrhea 08/02/2017  . Atrial fibrillation with RVR ([place]) 08/02/2017  . AKI (acute kidney injury) ([place]) 08/02/2017  . Sepsis ([place]) 08/02/2017  . CAP (community acquired pneumonia) 08/02/2017  . Rib fractures 08/02/2017  . Cervical cancer screening 01/31/2017  . Anemia 11/09/2015  . Other and unspecified hyperlipidemia 03/14/2013  . Fever blister 03/09/2013  . Endometrial polyp 11/01/2011  . Postmenopausal bleeding 10/01/2011  . Preventative health care 03/25/2011  . Personal history of kidney stones   . Heel spur   . Overweight   . Postmenopausal     Consultants None  Imaging: Dg Chest 2 View  Result Date: 08/03/2017 CLINICAL DATA:  Pneumothorax.  Rib fractures. EXAM: CHEST  2 VIEW COMPARISON:  Chest CT from yesterday FINDINGS: Multiple right rib fractures as seen by CT yesterday. No visible pneumothorax. Mild atelectasis at the bases. Stable generous heart size accentuated by low volumes. Negative mediastinal contours. IMPRESSION: 1. No visible pneumothorax. 2. Atelectasis and right rib fractures as seen on CT yesterday. Electronically Signed   By: Monte Fantasia M.D.   On: 08/03/2017 08:25   Dg Chest 2 View  Result Date: 08/02/2017 CLINICAL DATA:  Syncope and fall from a ladder today. Initial encounter. EXAM: CHEST  2 VIEW COMPARISON:  None. FINDINGS: Lung volumes are low with basilar atelectasis. No pneumothorax or pleural effusion. Heart size is upper normal. There are acute fractures of the right fifth, sixth, seventh and eighth ribs. No other fracture is identified. IMPRESSION: Right fifth through eighth rib fractures. Negative for  pneumothorax. Bibasilar subsegmental atelectasis. Electronically Signed   By: Inge Rise M.D.   On: 08/02/2017 16:59   Ct Chest W Contrast  Result Date: 08/02/2017 CLINICAL DATA:  56 year old female with trauma and suspected right rib fractures. EXAM: CT CHEST WITH CONTRAST TECHNIQUE: Multidetector CT imaging of the chest was performed during intravenous contrast administration. CONTRAST:  46mL ISOVUE-300 IOPAMIDOL (ISOVUE-300) INJECTION 61% COMPARISON:  Chest radiograph dated 08/02/2017 FINDINGS: Cardiovascular: There is mild cardiomegaly. No pericardial effusion. The thoracic aorta is unremarkable. The origins of the great vessels of the aortic arch are patent. The central pulmonary artery is is are unremarkable. Mediastinum/Nodes: There is no hilar or mediastinal adenopathy. The esophagus and thyroid gland are grossly unremarkable. No mediastinal fluid collection or hematoma. Lungs/Pleura: Bibasilar linear and streaky densities most consistent with atelectatic changes/scarring. A more confluent area of density at the right lung base may represent pulmonary contusion. Pneumonia is not excluded. Clinical correlation is recommended. There is small amount of fluid along the right fissure. There is a small right pneumothorax measuring up to 4 mm in thickness. Minimal left apical pneumothorax is suspected (series 4 image 18 and coronal series 6, image 57). The central airways are patent. Upper Abdomen: No acute abnormality. Musculoskeletal: Nondisplaced fractures of the right posterior sixth-ninth ribs. Small pockets of soft tissue gas external to the right rib cage. No large hematoma. IMPRESSION: 1. Multiple nondisplaced right rib fractures with a small right pneumothorax. A tiny left apical pneumothorax is also suspected. 2. Bibasilar atelectasis/scarring as well as possible area contusion in the right lung base. These results were called by telephone at the time of interpretation on 08/02/2017 at 7:06 pm to  Dr. Gareth Morgan , who verbally acknowledged these results. Electronically Signed   By: Anner Crete M.D.   On: 08/02/2017 19:14    Procedures None  Hospital Course:  Kristen Dunlap is a 56yo female who presented to [facility] 08/02/17 after falling about 8 feet from a ladder.  Workup showed multiple right rib fractures and an apical pneumothorax.  Patient was admitted for observation and pain control.  Repeat chest xray the following day showed no pneumothorax. On 08/03/17 the patient was voiding well, tolerating diet, ambulating well, pain well controlled, vital signs stable and felt stable for discharge home.  Patient will follow up in trauma clinic as needed.  I have personally reviewed the patients medication history on the [place] controlled substance database.   I was not directly involved in this patient's care therefore the information in this discharge summary was taken from the chart.    Allergies as of 08/03/2017      Reactions   Chlorhexidine Rash      Medication List    TAKE these medications   acetaminophen 325 MG tablet Commonly known as:  TYLENOL Take 2 tablets (650 mg total) by mouth every 4 (four) hours as needed for mild pain.   multivitamin with minerals Tabs tablet Take 1 tablet by mouth daily.   naproxen 250 MG tablet Commonly known as:  NAPROSYN Take 1 tablet (250 mg total) by mouth 2 (two) times daily with a meal.   oxyCODONE 5 MG immediate release tablet Commonly known as:  Oxy IR/ROXICODONE Take 1-2 tablets (5-10 mg total) by mouth every 6 (six) hours as needed for moderate pain.   polyethylene glycol packet Commonly known as:  MIRALAX / GLYCOLAX Take 17 g by mouth daily. Mix in 8 oz liquid and drink        Follow-up Information    [facility]. Call in 2 week(s).   Why:  call as needed for an appointment in trauma clinic for follow up from your rib fractures Contact information: [place]  [phone]          Signed: [place], [facility] 08/03/2017, 10:19 AM Pager: [phone] Consults: [phone] Mon-Fri 7:00 am-4:30 pm Sat-Sun 7:00 am-11:30 am

## 2017-08-03 NOTE — Discharge Instructions (Signed)

## 2017-08-03 NOTE — Progress Notes (Signed)
Patient ID: Kristen Dunlap, female   DOB: [birth year], 56 y.o.   MRN: [id number]     Subjective: Doing well. Some chest pain when moving about but fine at rest. No shortness of breath.  Objective: Vital signs in last 24 hours: Temp:  [98.1 F (36.7 C)-98.4 F (36.9 C)] 98.1 F (36.7 C) (08/05 0514) Pulse Rate:  [71-95] 81 (08/05 0514) Resp:  [16-32] 19 (08/05 0514) BP: (96-119)/(54-83) 107/68 (08/05 0514) SpO2:  [93 %-100 %] 100 % (08/05 0514) Last BM Date: 08/02/17  Intake/Output from previous day: 08/04 0701 - 08/05 0700 In: 2401.2 [P.O.:462; I.V.:939.2; IV Piggyback:1000] Out: -  Intake/Output this shift: Total I/O In: 120 [P.O.:120] Out: -   General appearance: alert, cooperative and no distress Resp: Clear equal breath sounds. No wheezing or increased work of breathing.  Lab Results:   Recent Labs  08/02/17 1552  WBC 14.7*  HGB 13.1  HCT 39.4  PLT 260   BMET  Recent Labs  08/02/17 1552  NA 143  K 4.3  CL 112*  CO2 25  GLUCOSE 113*  BUN 19  CREATININE 1.30*  CALCIUM 10.4*     Studies/Results: Dg Chest 2 View  Result Date: 08/03/2017 CLINICAL DATA:  Pneumothorax.  Rib fractures. EXAM: CHEST  2 VIEW COMPARISON:  Chest CT from yesterday FINDINGS: Multiple right rib fractures as seen by CT yesterday. No visible pneumothorax. Mild atelectasis at the bases. Stable generous heart size accentuated by low volumes. Negative mediastinal contours. IMPRESSION: 1. No visible pneumothorax. 2. Atelectasis and right rib fractures as seen on CT yesterday. Electronically Signed   By: Monte Fantasia M.D.   On: 08/03/2017 08:25   Dg Chest 2 View  Result Date: 08/02/2017 CLINICAL DATA:  Syncope and fall from a ladder today. Initial encounter. EXAM: CHEST  2 VIEW COMPARISON:  None. FINDINGS: Lung volumes are low with basilar atelectasis. No pneumothorax or pleural effusion. Heart size is upper normal. There are acute fractures of the right fifth, sixth, seventh and eighth  ribs. No other fracture is identified. IMPRESSION: Right fifth through eighth rib fractures. Negative for pneumothorax. Bibasilar subsegmental atelectasis. Electronically Signed   By: Inge Rise M.D.   On: 08/02/2017 16:59   Ct Chest W Contrast  Result Date: 08/02/2017 CLINICAL DATA:  56 year old female with trauma and suspected right rib fractures. EXAM: CT CHEST WITH CONTRAST TECHNIQUE: Multidetector CT imaging of the chest was performed during intravenous contrast administration. CONTRAST:  65mL ISOVUE-300 IOPAMIDOL (ISOVUE-300) INJECTION 61% COMPARISON:  Chest radiograph dated 08/02/2017 FINDINGS: Cardiovascular: There is mild cardiomegaly. No pericardial effusion. The thoracic aorta is unremarkable. The origins of the great vessels of the aortic arch are patent. The central pulmonary artery is is are unremarkable. Mediastinum/Nodes: There is no hilar or mediastinal adenopathy. The esophagus and thyroid gland are grossly unremarkable. No mediastinal fluid collection or hematoma. Lungs/Pleura: Bibasilar linear and streaky densities most consistent with atelectatic changes/scarring. A more confluent area of density at the right lung base may represent pulmonary contusion. Pneumonia is not excluded. Clinical correlation is recommended. There is small amount of fluid along the right fissure. There is a small right pneumothorax measuring up to 4 mm in thickness. Minimal left apical pneumothorax is suspected (series 4 image 18 and coronal series 6, image 57). The central airways are patent. Upper Abdomen: No acute abnormality. Musculoskeletal: Nondisplaced fractures of the right posterior sixth-ninth ribs. Small pockets of soft tissue gas external to the right rib cage. No large hematoma. IMPRESSION: 1. Multiple  nondisplaced right rib fractures with a small right pneumothorax. A tiny left apical pneumothorax is also suspected. 2. Bibasilar atelectasis/scarring as well as possible area contusion in the right  lung base. These results were called by telephone at the time of interpretation on 08/02/2017 at 7:06 pm to Dr. Gareth Morgan , who verbally acknowledged these results. Electronically Signed   By: Anner Crete M.D.   On: 08/02/2017 19:14    Anti-infectives: Anti-infectives    None      Assessment/Plan: Fall with multiple rib fractures. Tiny pneumothorax seen on CT. Chest x-ray negative today. Patient very comfortable without any respiratory difficulties and pain easily controlled with medications. Okay for discharge today.    LOS: 0 days    Julietta Batterman T 08/03/2017

## 2018-02-03 ENCOUNTER — Encounter: Payer: BLUE CROSS/BLUE SHIELD | Admitting: Family Medicine

## 2018-03-09 ENCOUNTER — Encounter: Payer: 59 | Admitting: Family Medicine

## 2018-03-31 ENCOUNTER — Ambulatory Visit (INDEPENDENT_AMBULATORY_CARE_PROVIDER_SITE_OTHER): Payer: 59 | Admitting: Family Medicine

## 2018-03-31 ENCOUNTER — Encounter: Payer: Self-pay | Admitting: Family Medicine

## 2018-03-31 VITALS — BP 110/68 | HR 66 | Temp 97.7°F | Resp 18 | Ht 65.0 in | Wt 182.4 lb

## 2018-03-31 DIAGNOSIS — D649 Anemia, unspecified: Secondary | ICD-10-CM

## 2018-03-31 DIAGNOSIS — Z0000 Encounter for general adult medical examination without abnormal findings: Secondary | ICD-10-CM

## 2018-03-31 DIAGNOSIS — E782 Mixed hyperlipidemia: Secondary | ICD-10-CM

## 2018-03-31 NOTE — Patient Instructions (Signed)
Shingrix is the new shingles shot, 2 shots over a 2 -6 month window.  Check with insurance confirm coverage then can get at pharmacy or at office  Preventive Care 40-64 Years, Female Preventive care refers to lifestyle choices and visits with your health care provider that can promote health and wellness. What does preventive care include?  A yearly physical exam. This is also called an annual well check.  Dental exams once or twice a year.  Routine eye exams. Ask your health care provider how often you should have your eyes checked.  Personal lifestyle choices, including: ? Daily care of your teeth and gums. ? Regular physical activity. ? Eating a healthy diet. ? Avoiding tobacco and drug use. ? Limiting alcohol use. ? Practicing safe sex. ? Taking low-dose aspirin daily starting at age 48. ? Taking vitamin and mineral supplements as recommended by your health care provider. What happens during an annual well check? The services and screenings done by your health care provider during your annual well check will depend on your age, overall health, lifestyle risk factors, and family history of disease. Counseling Your health care provider may ask you questions about your:  Alcohol use.  Tobacco use.  Drug use.  Emotional well-being.  Home and relationship well-being.  Sexual activity.  Eating habits.  Work and work Statistician.  Method of birth control.  Menstrual cycle.  Pregnancy history.  Screening You may have the following tests or measurements:  Height, weight, and BMI.  Blood pressure.  Lipid and cholesterol levels. These may be checked every 5 years, or more frequently if you are over 71 years old.  Skin check.  Lung cancer screening. You may have this screening every year starting at age 62 if you have a 30-pack-year history of smoking and currently smoke or have quit within the past 15 years.  Fecal occult blood test (FOBT) of the stool. You may  have this test every year starting at age 60.  Flexible sigmoidoscopy or colonoscopy. You may have a sigmoidoscopy every 5 years or a colonoscopy every 10 years starting at age 61.  Hepatitis C blood test.  Hepatitis B blood test.  Sexually transmitted disease (STD) testing.  Diabetes screening. This is done by checking your blood sugar (glucose) after you have not eaten for a while (fasting). You may have this done every 1-3 years.  Mammogram. This may be done every 1-2 years. Talk to your health care provider about when you should start having regular mammograms. This may depend on whether you have a family history of breast cancer.  BRCA-related cancer screening. This may be done if you have a family history of breast, ovarian, tubal, or peritoneal cancers.  Pelvic exam and Pap test. This may be done every 3 years starting at age 53. Starting at age 41, this may be done every 5 years if you have a Pap test in combination with an HPV test.  Bone density scan. This is done to screen for osteoporosis. You may have this scan if you are at high risk for osteoporosis.  Discuss your test results, treatment options, and if necessary, the need for more tests with your health care provider. Vaccines Your health care provider may recommend certain vaccines, such as:  Influenza vaccine. This is recommended every year.  Tetanus, diphtheria, and acellular pertussis (Tdap, Td) vaccine. You may need a Td booster every 10 years.  Varicella vaccine. You may need this if you have not been vaccinated.  Zoster  vaccine. You may need this after age 71.  Measles, mumps, and rubella (MMR) vaccine. You may need at least one dose of MMR if you were born in 1957 or later. You may also need a second dose.  Pneumococcal 13-valent conjugate (PCV13) vaccine. You may need this if you have certain conditions and were not previously vaccinated.  Pneumococcal polysaccharide (PPSV23) vaccine. You may need one or  two doses if you smoke cigarettes or if you have certain conditions.  Meningococcal vaccine. You may need this if you have certain conditions.  Hepatitis A vaccine. You may need this if you have certain conditions or if you travel or work in places where you may be exposed to hepatitis A.  Hepatitis B vaccine. You may need this if you have certain conditions or if you travel or work in places where you may be exposed to hepatitis B.  Haemophilus influenzae type b (Hib) vaccine. You may need this if you have certain conditions.  Talk to your health care provider about which screenings and vaccines you need and how often you need them. This information is not intended to replace advice given to you by your health care provider. Make sure you discuss any questions you have with your health care provider. Document Released: 01/12/2016 Document Revised: 09/04/2016 Document Reviewed: 10/17/2015 Elsevier Interactive Patient Education  [employer].

## 2018-03-31 NOTE — Assessment & Plan Note (Signed)
Encouraged heart healthy diet, increase exercise, avoid trans fats, consider a krill oil cap daily 

## 2018-03-31 NOTE — Progress Notes (Signed)
Subjective:  I acted as a Education administrator for Dr. Charlett Blake. [place], [place]  Patient ID: Kristen Dunlap, female    DOB: 10/30/1961, 57 y.o.   MRN: [id number]  No chief complaint on file.   HPI  Patient is in today for an annual exam and she notes feeling well today. No recent febrile illness or hospitalizations. She is doing well with Activities of daily living. She has some trouble with constipation at times and uses Miralax with some relief. No bloody or tarry stool. Denies CP/palp/SOB/HA/congestion/fevers or GU c/o. Taking meds as prescribed.  No anorexia, nausea or vomiting.  Patient Care Team: Mosie Lukes, MD as PCP - General (Family Medicine) Milus Banister, MD as Attending Physician (Gastroenterology)   Past Medical History:  Diagnosis Date  . Anemia 11/09/2015  . Fever blister 03/09/2013  . Heel spur    right  . Hx of cold sores 03/09/2013  . Other and unspecified hyperlipidemia 03/14/2013  . Overweight(278.02)   . Personal history of kidney stones   . Postmenopausal   . Postmenopausal bleeding 10/01/2011    Past Surgical History:  Procedure Laterality Date  . [place]   X 2  . [place]  . HYSTEROSCOPY  11/29/2011   Procedure: HYSTEROSCOPY;  Surgeon: Terrance Mass, MD;  Location: [facility];  Service: Gynecology;  Laterality: N/A;  with Myosure  . TONSILLECTOMY  AS CHILD  . TUBAL LIGATION  15 YRS AGO    Family History  Problem Relation Age of Onset  . Diabetes Father   . Cancer Father        throat ca/ smoker  . Dementia Father   . Cancer Paternal Grandmother        esophagus, cancer  . Other Sister 18       PLS  . Early death Sister   . Colon cancer Neg Hx     Social History   Socioeconomic History  . Marital status: Married    Spouse name: Not on file  . Number of children: Not on file  . Years of education: Not on file  . Highest education level: Not on file  Occupational History  . Not on file    Social Needs  . Financial resource strain: Not on file  . Food insecurity:    Worry: Not on file    Inability: Not on file  . Transportation needs:    Medical: Not on file    Non-medical: Not on file  Tobacco Use  . Smoking status: Never Smoker  . Smokeless tobacco: Never Used  Substance and Sexual Activity  . Alcohol use: Yes    Comment: occasionaly  . Drug use: No  . 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 Physical activity:    Days per week: Not on file    Minutes per session: Not on file  . Stress: Not on file  Relationships  . Social connections:    Talks on phone: Not on file    Gets together: Not on file    Attends religious service: Not on file    Active member of club or organization: Not on file    Attends meetings of clubs or organizations: Not on file    Relationship status: Not on file  . Intimate partner violence:  Fear of current or ex partner: Not on file    Emotionally abused: Not on file    Physically abused: Not on file    Forced sexual activity: Not on file  Other Topics Concern  . Not on file  Social History Narrative  . Not on file    Outpatient Medications Prior to Visit  Medication Sig Dispense Refill  . Multiple Vitamin (MULTIVITAMIN WITH MINERALS) TABS tablet Take 1 tablet by mouth daily.    . polyethylene glycol (MIRALAX / GLYCOLAX) packet Take 17 g by mouth daily. Mix in 8 oz liquid and drink    . acetaminophen (TYLENOL) 325 MG tablet Take 2 tablets (650 mg total) by mouth every 4 (four) hours as needed for mild pain.    . naproxen (NAPROSYN) 250 MG tablet Take 1 tablet (250 mg total) by mouth 2 (two) times daily with a meal.    . oxyCODONE (OXY IR/ROXICODONE) 5 MG immediate release tablet Take 1-2 tablets (5-10 mg total) by mouth every 6 (six) hours as needed for moderate pain. 25 tablet 0   No facility-administered  medications prior to visit.     Allergies  Allergen Reactions  . Chlorhexidine Rash    Review of Systems  Constitutional: Negative for fever and malaise/fatigue.  HENT: Negative for congestion.   Eyes: Negative for blurred vision.  Respiratory: Negative for shortness of breath.   Cardiovascular: Negative for chest pain, palpitations and leg swelling.  Gastrointestinal: Negative for abdominal pain, blood in stool and nausea.  Genitourinary: Negative for dysuria and frequency.  Musculoskeletal: Negative for falls.  Skin: Negative for rash.  Neurological: Negative for dizziness, loss of consciousness and headaches.  Endo/Heme/Allergies: Negative for environmental allergies.  Psychiatric/Behavioral: Negative for depression. The patient is not nervous/anxious.        Objective:    Physical Exam  Constitutional: She is oriented to person, place, and time. She appears well-developed and well-nourished. No distress.  HENT:  Head: Normocephalic and atraumatic.  Eyes: Conjunctivae are normal.  Neck: Neck supple. No thyromegaly present.  Cardiovascular: Normal rate, regular rhythm and normal heart sounds.  No murmur heard. Pulmonary/Chest: Effort normal and breath sounds normal. No respiratory distress.  Abdominal: Soft. Bowel sounds are normal. She exhibits no distension and no mass. There is no tenderness.  Musculoskeletal: She exhibits no edema.  Lymphadenopathy:    She has no cervical adenopathy.  Neurological: She is alert and oriented to person, place, and time.  Skin: Skin is warm and dry.  Psychiatric: She has a normal mood and affect. Her behavior is normal.    BP 110/68 (BP Location: Left Arm, Patient Position: Sitting, Cuff Size: Normal)   Pulse 66   Temp 97.7 F (36.5 C) (Oral)   Resp 18   Ht 5\' 5"  (1.651 m)   Wt 182 lb 6.4 oz (82.7 kg)   SpO2 98%   BMI 30.35 kg/m  Wt Readings from Last 3 Encounters:  03/31/18 182 lb 6.4 oz (82.7 kg)  08/03/17 168 lb 6.9 oz  (76.4 kg)  10/27/15 168 lb 6 oz (76.4 kg)   BP Readings from Last 3 Encounters:  03/31/18 110/68  08/03/17 107/68  10/27/15 124/88      There is no immunization history on file for this patient.  Health Maintenance  Topic Date Due  . Hepatitis C Screening  24-Jan-1961  . HIV Screening  08/25/1977  . TETANUS/TDAP  08/25/1981  . INFLUENZA VACCINE  03/30/2019 (Originally 07/30/2018)  . MAMMOGRAM  06/06/2019  .  PAP SMEAR  02/01/2020  . COLONOSCOPY  04/16/2020    Lab Results  Component Value Date   WBC 6.7 03/31/2018   HGB 14.2 03/31/2018   HCT 42.4 03/31/2018   PLT 298.0 03/31/2018   GLUCOSE 88 03/31/2018   CHOL 225 (H) 03/31/2018   TRIG 124.0 03/31/2018   HDL 67.00 03/31/2018   LDLDIRECT 115.0 03/25/2011   LDLCALC 133 (H) 03/31/2018   ALT 13 03/31/2018   AST 12 03/31/2018   NA 140 03/31/2018   K 4.6 03/31/2018   CL 107 03/31/2018   CREATININE 0.69 03/31/2018   BUN 20 03/31/2018   CO2 27 03/31/2018   TSH 4.58 (H) 03/31/2018   INR 0.87 09/30/2011    Lab Results  Component Value Date   TSH 4.58 (H) 03/31/2018   Lab Results  Component Value Date   WBC 6.7 03/31/2018   HGB 14.2 03/31/2018   HCT 42.4 03/31/2018   MCV 88.9 03/31/2018   PLT 298.0 03/31/2018   Lab Results  Component Value Date   NA 140 03/31/2018   K 4.6 03/31/2018   CO2 27 03/31/2018   GLUCOSE 88 03/31/2018   BUN 20 03/31/2018   CREATININE 0.69 03/31/2018   BILITOT 0.7 03/31/2018   ALKPHOS 88 03/31/2018   AST 12 03/31/2018   ALT 13 03/31/2018   PROT 7.2 03/31/2018   ALBUMIN 4.3 03/31/2018   CALCIUM 10.7 (H) 03/31/2018   ANIONGAP 6 08/02/2017   GFR 93.68 03/31/2018   Lab Results  Component Value Date   CHOL 225 (H) 03/31/2018   Lab Results  Component Value Date   HDL 67.00 03/31/2018   Lab Results  Component Value Date   LDLCALC 133 (H) 03/31/2018   Lab Results  Component Value Date   TRIG 124.0 03/31/2018   Lab Results  Component Value Date   CHOLHDL 3 03/31/2018   No  results found for: HGBA1C       Assessment & Plan:   Problem List Items Addressed This Visit    Preventative health care - Primary    Patient encouraged to maintain heart healthy diet, regular exercise, adequate sleep. Consider daily probiotics. Take medications as prescribed. Labs ordered and reviewed. Given and reviewed copy of ACP documents from [employer] and encouraged to complete and return      Relevant Orders   Comprehensive metabolic panel (Completed)   Lipid panel (Completed)   TSH (Completed)   Hyperlipidemia    Encouraged heart healthy diet, increase exercise, avoid trans fats, consider a krill oil cap daily      Anemia   Relevant Orders   CBC (Completed)      I have discontinued Tanisa T. Trammel's acetaminophen, naproxen, and oxyCODONE. I am also having her maintain her multivitamin with minerals and polyethylene glycol.  No orders of the defined types were placed in this encounter.   CMA served as Education administrator during this visit. History, Physical and Plan performed by medical provider. Documentation and orders reviewed and attested to.  Kristen Homans, MD

## 2018-04-01 LAB — COMPREHENSIVE METABOLIC PANEL
ALBUMIN: 4.3 g/dL (ref 3.5–5.2)
ALT: 13 U/L (ref 0–35)
AST: 12 U/L (ref 0–37)
Alkaline Phosphatase: 88 U/L (ref 39–117)
BILIRUBIN TOTAL: 0.7 mg/dL (ref 0.2–1.2)
BUN: 20 mg/dL (ref 6–23)
CALCIUM: 10.7 mg/dL — AB (ref 8.4–10.5)
CHLORIDE: 107 meq/L (ref 96–112)
CO2: 27 mEq/L (ref 19–32)
CREATININE: 0.69 mg/dL (ref 0.40–1.20)
GFR: 93.68 mL/min (ref >60.00)
Glucose, Bld: 88 mg/dL (ref 70–99)
Potassium: 4.6 mEq/L (ref 3.5–5.1)
SODIUM: 140 meq/L (ref 135–145)
Total Protein: 7.2 g/dL (ref 6.0–8.3)

## 2018-04-01 LAB — CBC
HCT: 42.4 % (ref 36.0–46.0)
Hemoglobin: 14.2 g/dL (ref 12.0–15.0)
MCHC: 33.5 g/dL (ref 30.0–36.0)
MCV: 88.9 fl (ref 78.0–100.0)
PLATELETS: 298 10*3/uL (ref 150.0–400.0)
RBC: 4.77 Mil/uL (ref 3.87–5.11)
RDW: 13.9 % (ref 11.5–15.5)
WBC: 6.7 10*3/uL (ref 4.0–10.5)

## 2018-04-01 LAB — LIPID PANEL
CHOL/HDL RATIO: 3
CHOLESTEROL: 225 mg/dL — AB (ref 0–200)
HDL: 67 mg/dL (ref >39.00)
LDL CALC: 133 mg/dL — AB (ref 0–99)
NonHDL: 158.18
TRIGLYCERIDES: 124 mg/dL (ref 0.0–149.0)
VLDL: 24.8 mg/dL (ref 0.0–40.0)

## 2018-04-01 LAB — TSH: TSH: 4.58 u[IU]/mL — AB (ref 0.35–4.50)

## 2018-04-02 NOTE — Assessment & Plan Note (Signed)
Patient encouraged to maintain heart healthy diet, regular exercise, adequate sleep. Consider daily probiotics. Take medications as prescribed. Labs ordered and reviewed. Given and reviewed copy of ACP documents from [place] Secretary of State and encouraged to complete and return 

## 2018-07-23 DIAGNOSIS — Z1231 Encounter for screening mammogram for malignant neoplasm of breast: Secondary | ICD-10-CM | POA: Diagnosis not present

## 2018-07-23 LAB — HM MAMMOGRAPHY

## 2018-07-31 ENCOUNTER — Encounter: Payer: Self-pay | Admitting: Family Medicine

## 2019-04-02 ENCOUNTER — Encounter: Payer: 59 | Admitting: Family Medicine

## 2019-06-08 ENCOUNTER — Encounter: Payer: 59 | Admitting: Family Medicine

## 2019-09-02 LAB — HM MAMMOGRAPHY

## 2019-09-27 ENCOUNTER — Telehealth: Payer: Self-pay | Admitting: Family Medicine

## 2019-09-27 NOTE — Telephone Encounter (Signed)
Copied from [place] [id number]. Topic: General - Other >> Sep 27, 2019  1:42 PM Keene Breath wrote: Reason for CRM: Patient called to schedule an annual physical appt.  Last physical was 03/2018.  Please call patient to schedule.  CB# [phone]

## 2020-02-29 ENCOUNTER — Other Ambulatory Visit: Payer: Self-pay

## 2020-02-29 ENCOUNTER — Encounter: Payer: Self-pay | Admitting: Family Medicine

## 2020-02-29 ENCOUNTER — Encounter: Payer: Self-pay | Admitting: Gastroenterology

## 2020-02-29 ENCOUNTER — Ambulatory Visit (INDEPENDENT_AMBULATORY_CARE_PROVIDER_SITE_OTHER): Payer: Managed Care, Other (non HMO) | Admitting: Family Medicine

## 2020-02-29 VITALS — BP 122/84 | HR 65 | Temp 97.1°F | Resp 16 | Ht 65.0 in | Wt 176.0 lb

## 2020-02-29 DIAGNOSIS — Z0000 Encounter for general adult medical examination without abnormal findings: Secondary | ICD-10-CM | POA: Diagnosis not present

## 2020-02-29 DIAGNOSIS — E782 Mixed hyperlipidemia: Secondary | ICD-10-CM

## 2020-02-29 DIAGNOSIS — D649 Anemia, unspecified: Secondary | ICD-10-CM

## 2020-02-29 DIAGNOSIS — R7989 Other specified abnormal findings of blood chemistry: Secondary | ICD-10-CM | POA: Diagnosis not present

## 2020-02-29 DIAGNOSIS — Z86010 Personal history of colonic polyps: Secondary | ICD-10-CM

## 2020-02-29 LAB — LIPID PANEL
Cholesterol: 220 mg/dL — ABNORMAL HIGH (ref 0–200)
HDL: 71.8 mg/dL (ref >39.00)
LDL Cholesterol: 119 mg/dL — ABNORMAL HIGH (ref 0–99)
NonHDL: 147.73
Total CHOL/HDL Ratio: 3
Triglycerides: 142 mg/dL (ref 0.0–149.0)
VLDL: 28.4 mg/dL (ref 0.0–40.0)

## 2020-02-29 LAB — COMPREHENSIVE METABOLIC PANEL
ALT: 10 U/L (ref 0–35)
AST: 11 U/L (ref 0–37)
Albumin: 4.3 g/dL (ref 3.5–5.2)
Alkaline Phosphatase: 76 U/L (ref 39–117)
BUN: 19 mg/dL (ref 6–23)
CO2: 29 mEq/L (ref 19–32)
Calcium: 11.3 mg/dL — ABNORMAL HIGH (ref 8.4–10.5)
Chloride: 107 mEq/L (ref 96–112)
Creatinine, Ser: 0.72 mg/dL (ref 0.40–1.20)
GFR: 83.34 mL/min (ref >60.00)
Glucose, Bld: 91 mg/dL (ref 70–99)
Potassium: 4.8 mEq/L (ref 3.5–5.1)
Sodium: 140 mEq/L (ref 135–145)
Total Bilirubin: 1 mg/dL (ref 0.2–1.2)
Total Protein: 6.8 g/dL (ref 6.0–8.3)

## 2020-02-29 LAB — CBC
HCT: 39.5 % (ref 36.0–46.0)
Hemoglobin: 13.1 g/dL (ref 12.0–15.0)
MCHC: 33.3 g/dL (ref 30.0–36.0)
MCV: 87.5 fl (ref 78.0–100.0)
Platelets: 275 10*3/uL (ref 150.0–400.0)
RBC: 4.52 Mil/uL (ref 3.87–5.11)
RDW: 13.7 % (ref 11.5–15.5)
WBC: 5.7 10*3/uL (ref 4.0–10.5)

## 2020-02-29 LAB — T4, FREE: Free T4: 0.72 ng/dL (ref 0.60–1.60)

## 2020-02-29 LAB — TSH: TSH: 2.69 u[IU]/mL (ref 0.35–4.50)

## 2020-02-29 NOTE — Patient Instructions (Addendum)
Omron Blood Pressure cuff, upper arm, want BP 100-140/60-90 Pulse oximeter, want oxygen in 90s  Weekly vitals  Take Multivitamin with minerals, selenium Vitamin D 1000-2000 IU daily Probiotic with lactobacillus and bifidophilus Asprin EC 81 mg daily  Melatonin 2-5 mg at bedtime  [email or web address]   Shingrix is the new shingles shot, 2 shots 2  To 6 months apart, check with insurance regarding and document, then ask where is the best price point at pharmacy vs office and call for nurse appt if would like shot in office.  Separate Shingrix from COVID shots by at least 45 days   The mRNA technology has been in development for 20 years and we already had the Coronavirus family of viruses (which usually just cause the common cold) genetically mapped already which is why we were able to come up with viable vaccine candidates so quickly in stage 1, then stage 2 scientifically took the correct amount of time what we did to speed it up was just build the manufacturing platform at the same time we were running the experiments so if it worked we could produce faster. And stage 3 has now had many months and millions of people immunized and we are seeing the immunity hold for over 9 months now with sign of it dissipating and no significant numbers of adverse reactions.  During every flu season we see 2 anaphylactic reactions for every million shots given and we initially thought we would see 11 per million with the COVID vaccine but now we see only 2-3 with Moderna and 5 or so with [place] so compared to someone is dying every 20 minutes from [place] and more deadly and infectious strains are coming it is definitely best when weighing the risks and benefits to take the shots.  Another pooled analysis of the 5 most utilized vaccines in the world shows that after full immunization so far no one has died from [place].  Preventive Care 65-50 Years Old, Female Preventive care  refers to visits with your health care provider and lifestyle choices that can promote health and wellness. This includes:  A yearly physical exam. This may also be called an annual well check.  Regular dental visits and eye exams.  Immunizations.  Screening for certain conditions.  Healthy lifestyle choices, such as eating a healthy diet, getting regular exercise, not using drugs or products that contain nicotine and tobacco, and limiting alcohol use. What can I expect for my preventive care visit? Physical exam Your health care provider will check your:  Height and weight. This may be used to calculate body mass index (BMI), which tells if you are at a healthy weight.  Heart rate and blood pressure.  Skin for abnormal spots. Counseling Your health care provider may ask you questions about your:  Alcohol, tobacco, and drug use.  Emotional well-being.  Home and relationship well-being.  Sexual activity.  Eating habits.  Work and work Statistician.  Method of birth control.  Menstrual cycle.  Pregnancy history. What immunizations do I need?  Influenza (flu) vaccine  This is recommended every year. Tetanus, diphtheria, and pertussis (Tdap) vaccine  You may need a Td booster every 10 years. Varicella (chickenpox) vaccine  You may need this if you have not been vaccinated. Zoster (shingles) vaccine  You may need this after age 24. Measles, mumps, and rubella (MMR) vaccine  You may need at least one dose of MMR if you were born in 1957 or later. You may also need a  second dose. Pneumococcal conjugate (PCV13) vaccine  You may need this if you have certain conditions and were not previously vaccinated. Pneumococcal polysaccharide (PPSV23) vaccine  You may need one or two doses if you smoke cigarettes or if you have certain conditions. Meningococcal conjugate (MenACWY) vaccine  You may need this if you have certain conditions. Hepatitis A vaccine  You may  need this if you have certain conditions or if you travel or work in places where you may be exposed to hepatitis A. Hepatitis B vaccine  You may need this if you have certain conditions or if you travel or work in places where you may be exposed to hepatitis B. Haemophilus influenzae type b (Hib) vaccine  You may need this if you have certain conditions. Human papillomavirus (HPV) vaccine  If recommended by your health care provider, you may need three doses over 6 months. You may receive vaccines as individual doses or as more than one vaccine together in one shot (combination vaccines). Talk with your health care provider about the risks and benefits of combination vaccines. What tests do I need? Blood tests  Lipid and cholesterol levels. These may be checked every 5 years, or more frequently if you are over 63 years old.  Hepatitis C test.  Hepatitis B test. Screening  Lung cancer screening. You may have this screening every year starting at age 65 if you have a 30-pack-year history of smoking and currently smoke or have quit within the past 15 years.  Colorectal cancer screening. All adults should have this screening starting at age 55 and continuing until age 87. Your health care provider may recommend screening at age 75 if you are at increased risk. You will have tests every 1-10 years, depending on your results and the type of screening test.  Diabetes screening. This is done by checking your blood sugar (glucose) after you have not eaten for a while (fasting). You may have this done every 1-3 years.  Mammogram. This may be done every 1-2 years. Talk with your health care provider about when you should start having regular mammograms. This may depend on whether you have a family history of breast cancer.  BRCA-related cancer screening. This may be done if you have a family history of breast, ovarian, tubal, or peritoneal cancers.  Pelvic exam and Pap test. This may be done  every 3 years starting at age 77. Starting at age 36, this may be done every 5 years if you have a Pap test in combination with an HPV test. Other tests  Sexually transmitted disease (STD) testing.  Bone density scan. This is done to screen for osteoporosis. You may have this scan if you are at high risk for osteoporosis. Follow these instructions at home: Eating and drinking  Eat a diet that includes fresh fruits and vegetables, whole grains, lean protein, and low-fat dairy.  Take vitamin and mineral supplements as recommended by your health care provider.  Do not drink alcohol if: ? Your health care provider tells you not to drink. ? You are pregnant, may be pregnant, or are planning to become pregnant.  If you drink alcohol: ? Limit how much you have to 0-1 drink a day. ? Be aware of how much alcohol is in your drink. In the [place] one drink equals one 12 oz bottle of beer (355 mL), one 5 oz glass of wine (148 mL), or one 1 oz glass of hard liquor (44 mL). Lifestyle  Take daily care of  your teeth and gums.  Stay active. Exercise for at least 30 minutes on 5 or more days each week.  Do not use any products that contain nicotine or tobacco, such as cigarettes, e-cigarettes, and chewing tobacco. If you need help quitting, ask your health care provider.  If you are sexually active, practice safe sex. Use a condom or other form of birth control (contraception) in order to prevent pregnancy and STIs (sexually transmitted infections).  If told by your health care provider, take low-dose aspirin daily starting at age 79. What's next?  Visit your health care provider once a year for a well check visit.  Ask your health care provider how often you should have your eyes and teeth checked.  Stay up to date on all vaccines. This information is not intended to replace advice given to you by your health care provider. Make sure you discuss any questions you have with your health care  provider. Document Revised: 08/27/2018 Document Reviewed: 08/27/2018 Elsevier Patient Education  [place] of time what we did to speed it up was just build the manufacturing platform at the same time we were running the experiments so if it worked we could produce faster. And stage 3 has now had many months and millions of people immunized and we are seeing the immunity hold for over 9 months now with sign of it dissipating and no significant numbers of adverse reactions.  During every flu season we see 2 anaphylactic reactions for every million shots given and we initially thought we would see 11 per million with the COVID vaccine but now we see only 2-3 with Moderna and 5 or so with [place] so compared to someone is dying every 20 minutes from [place] and more deadly and infectious strains are coming it is definitely best when weighing the risks and benefits to take the shots.  Another pooled analysis of the 5 most utilized vaccines in the world shows that after full immunization so far no one has died from [place].

## 2020-03-01 ENCOUNTER — Other Ambulatory Visit (INDEPENDENT_AMBULATORY_CARE_PROVIDER_SITE_OTHER): Payer: Managed Care, Other (non HMO)

## 2020-03-01 DIAGNOSIS — R7989 Other specified abnormal findings of blood chemistry: Secondary | ICD-10-CM | POA: Insufficient documentation

## 2020-03-01 DIAGNOSIS — Z860101 Personal history of adenomatous and serrated colon polyps: Secondary | ICD-10-CM | POA: Insufficient documentation

## 2020-03-01 DIAGNOSIS — Z86010 Personal history of colonic polyps: Secondary | ICD-10-CM | POA: Insufficient documentation

## 2020-03-01 LAB — VITAMIN D 25 HYDROXY (VIT D DEFICIENCY, FRACTURES): VITD: 35.45 ng/mL (ref 30.00–100.00)

## 2020-03-01 NOTE — Assessment & Plan Note (Signed)
Labs ordered and reviewed today. [place] has normalized

## 2020-03-01 NOTE — Progress Notes (Signed)
Subjective:    Patient ID: Kristen Dunlap, female    DOB: [birth year], 59 y.o.   MRN: [id number]  Chief Complaint  Patient presents with  . Annual Exam    HPI Patient is in today for annual preventative exam. She is doing well. No recent febrile illness or hospitalizations. No acute concerns. She is eating well and staying active. She is maintaining quarantine well. Is interested in the COVID vaccine when it is available to her. Denies CP/palp/SOB/HA/congestion/fevers/GI or GU c/o. Taking meds as prescribed  Past Medical History:  Diagnosis Date  . Anemia 11/09/2015  . Fever blister 03/09/2013  . Heel spur    right  . Hx of cold sores 03/09/2013  . Other and unspecified hyperlipidemia 03/14/2013  . Overweight(278.02)   . Personal history of kidney stones   . Postmenopausal   . Postmenopausal bleeding 10/01/2011    Past Surgical History:  Procedure Laterality Date  . [place]   X 2  . [place]  . HYSTEROSCOPY  11/29/2011   Procedure: HYSTEROSCOPY;  Surgeon: Terrance Mass, MD;  Location: [facility];  Service: Gynecology;  Laterality: N/A;  with Myosure  . TONSILLECTOMY  AS CHILD  . TUBAL LIGATION  15 YRS AGO    Family History  Problem Relation Age of Onset  . Diabetes Father   . Cancer Father        throat ca/ smoker  . Dementia Father   . Cancer Paternal Grandmother        esophagus, cancer  . Other Sister 101       PLS  . Early death Sister   . Colon cancer Neg Hx     Social History   Socioeconomic History  . Marital status: Married    Spouse name: Not on file  . Number of children: Not on file  . Years of education: Not on file  . Highest education level: Not on file  Occupational History  . Not on file  Tobacco Use  . Smoking status: Never Smoker  . Smokeless tobacco: Never Used  Substance and Sexual Activity  . Alcohol use: Yes    Comment: occasionaly  . Drug use: No  . 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 Not on file  Social History Narrative  . Not on file   Social Determinants of Health   Financial Resource Strain:   . Difficulty of Paying Living Expenses: Not on file  Food Insecurity:   . Worried About Charity fundraiser in the Last Year: Not on file  . Ran Out of Food in the Last Year: Not on file  Transportation Needs:   . Lack of Transportation (Medical): Not on file  . Lack of Transportation (Non-Medical): Not on file  Physical Activity:   . Days of Exercise per Week: Not on file  . Minutes of Exercise per Session: Not on file  Stress:   . Feeling of Stress : Not on file  Social Connections:   . Frequency of Communication with Friends and Family: Not on file  . Frequency of Social Gatherings with Friends and Family: Not on file  . Attends Religious Services: Not on file  . Active Member of Clubs or Organizations: Not on file  . Attends Archivist Meetings: Not on file  .  Marital Status: Not on file  Intimate Partner Violence:   . Fear of Current or Ex-Partner: Not on file  . Emotionally Abused: Not on file  . Physically Abused: Not on file  . Sexually Abused: Not on file    Outpatient Medications Prior to Visit  Medication Sig Dispense Refill  . KRILL OIL PO Take by mouth.    . Multiple Vitamin (MULTIVITAMIN WITH MINERALS) TABS tablet Take 1 tablet by mouth daily.    . polyethylene glycol (MIRALAX / GLYCOLAX) packet Take 17 g by mouth daily. Mix in 8 oz liquid and drink     No facility-administered medications prior to visit.    Allergies  Allergen Reactions  . Chlorhexidine Rash    Review of Systems  Constitutional: Negative for chills, fever and malaise/fatigue.  HENT: Negative for congestion and hearing loss.   Eyes: Negative for discharge.  Respiratory: Negative for cough, sputum production and  shortness of breath.   Cardiovascular: Negative for chest pain, palpitations and leg swelling.  Gastrointestinal: Negative for abdominal pain, blood in stool, constipation, diarrhea, heartburn, nausea and vomiting.  Genitourinary: Negative for dysuria, frequency, hematuria and urgency.  Musculoskeletal: Negative for back pain, falls and myalgias.  Skin: Negative for rash.  Neurological: Negative for dizziness, sensory change, loss of consciousness, weakness and headaches.  Endo/Heme/Allergies: Negative for environmental allergies. Does not bruise/bleed easily.  Psychiatric/Behavioral: Negative for depression and suicidal ideas. The patient is not nervous/anxious and does not have insomnia.        Objective:    Physical Exam Constitutional:      General: She is not in acute distress.    Appearance: She is not diaphoretic.  HENT:     Head: Normocephalic and atraumatic.     Right Ear: External ear normal.     Left Ear: External ear normal.     Nose: Nose normal.     Mouth/Throat:     Pharynx: No oropharyngeal exudate.  Eyes:     General: No scleral icterus.       Right eye: No discharge.        Left eye: No discharge.     Conjunctiva/sclera: Conjunctivae normal.     Pupils: Pupils are equal, round, and reactive to light.  Neck:     Thyroid: No thyromegaly.  Cardiovascular:     Rate and Rhythm: Normal rate and regular rhythm.     Heart sounds: Normal heart sounds. No murmur.  Pulmonary:     Effort: Pulmonary effort is normal. No respiratory distress.     Breath sounds: Normal breath sounds. No wheezing or rales.  Abdominal:     General: Bowel sounds are normal. There is no distension.     Palpations: Abdomen is soft. There is no mass.     Tenderness: There is no abdominal tenderness.  Musculoskeletal:        General: No tenderness. Normal range of motion.     Cervical back: Normal range of motion and neck supple.  Lymphadenopathy:     Cervical: No cervical adenopathy.    Skin:    General: Skin is warm and dry.     Findings: No rash.  Neurological:     Mental Status: She is alert and oriented to person, place, and time.     Cranial Nerves: No cranial nerve deficit.     Coordination: Coordination normal.     Deep Tendon Reflexes: Reflexes are normal and symmetric. Reflexes normal.     BP 122/84 (BP Location:  Left Arm, Patient Position: Sitting, Cuff Size: Normal)   Pulse 65   Temp (!) 97.1 F (36.2 C) (Temporal)   Resp 16   Ht 5\' 5"  (1.651 m)   Wt 176 lb (79.8 kg)   SpO2 98%   BMI 29.29 kg/m  Wt Readings from Last 3 Encounters:  02/29/20 176 lb (79.8 kg)  03/31/18 182 lb 6.4 oz (82.7 kg)  08/03/17 168 lb 6.9 oz (76.4 kg)    Diabetic Foot Exam - Simple   No data filed     Lab Results  Component Value Date   WBC 5.7 02/29/2020   HGB 13.1 02/29/2020   HCT 39.5 02/29/2020   PLT 275.0 02/29/2020   GLUCOSE 91 02/29/2020   CHOL 220 (H) 02/29/2020   TRIG 142.0 02/29/2020   HDL 71.80 02/29/2020   LDLDIRECT 115.0 03/25/2011   LDLCALC 119 (H) 02/29/2020   ALT 10 02/29/2020   AST 11 02/29/2020   NA 140 02/29/2020   K 4.8 02/29/2020   CL 107 02/29/2020   CREATININE 0.72 02/29/2020   BUN 19 02/29/2020   CO2 29 02/29/2020   TSH 2.69 02/29/2020   INR 0.87 09/30/2011    Lab Results  Component Value Date   TSH 2.69 02/29/2020   Lab Results  Component Value Date   WBC 5.7 02/29/2020   HGB 13.1 02/29/2020   HCT 39.5 02/29/2020   MCV 87.5 02/29/2020   PLT 275.0 02/29/2020   Lab Results  Component Value Date   NA 140 02/29/2020   K 4.8 02/29/2020   CO2 29 02/29/2020   GLUCOSE 91 02/29/2020   BUN 19 02/29/2020   CREATININE 0.72 02/29/2020   BILITOT 1.0 02/29/2020   ALKPHOS 76 02/29/2020   AST 11 02/29/2020   ALT 10 02/29/2020   PROT 6.8 02/29/2020   ALBUMIN 4.3 02/29/2020   CALCIUM 11.3 (H) 02/29/2020   ANIONGAP 6 08/02/2017   GFR 83.34 02/29/2020   Lab Results  Component Value Date   CHOL 220 (H) 02/29/2020   Lab  Results  Component Value Date   HDL 71.80 02/29/2020   Lab Results  Component Value Date   LDLCALC 119 (H) 02/29/2020   Lab Results  Component Value Date   TRIG 142.0 02/29/2020   Lab Results  Component Value Date   CHOLHDL 3 02/29/2020   No results found for: HGBA1C     Assessment & Plan:   Problem List Items Addressed This Visit    Preventative health care    Patient encouraged to maintain heart healthy diet, regular exercise, adequate sleep. Consider daily probiotics. Take medications as prescribed. Labs ordered and reviewed. Referred to GI for colonoscopy. [facility]. Pap with next visit.      Relevant Orders   CBC (Completed)   Comprehensive metabolic panel (Completed)   Lipid panel (Completed)   TSH (Completed)   Hyperlipidemia - Primary    Encouraged heart healthy diet, increase exercise, avoid trans fats, consider a krill oil cap daily      Relevant Orders   Lipid panel (Completed)   Anemia   Abnormal TSH    Labs ordered and reviewed today. [place] has normalized      Relevant Orders   T4, free (Completed)   H/O adenomatous polyp of colon    Is due for her 5 year recall next month is referred back to gastroenterology for care.       Relevant Orders  Ambulatory referral to Gastroenterology      I am having Kristen Dunlap maintain her multivitamin with minerals, polyethylene glycol, and KRILL OIL PO.  No orders of the defined types were placed in this encounter.    Penni Homans, MD

## 2020-03-01 NOTE — Assessment & Plan Note (Signed)
Is due for her 5 year recall next month is referred back to gastroenterology for care.

## 2020-03-01 NOTE — Assessment & Plan Note (Signed)
Encouraged heart healthy diet, increase exercise, avoid trans fats, consider a krill oil cap daily 

## 2020-03-01 NOTE — Assessment & Plan Note (Signed)
Patient encouraged to maintain heart healthy diet, regular exercise, adequate sleep. Consider daily probiotics. Take medications as prescribed. Labs ordered and reviewed. Referred to GI for colonoscopy. Release of [facility]. Pap with next visit.

## 2020-03-06 ENCOUNTER — Encounter: Payer: Self-pay | Admitting: *Deleted

## 2020-03-06 ENCOUNTER — Other Ambulatory Visit: Payer: Self-pay | Admitting: *Deleted

## 2020-04-06 ENCOUNTER — Other Ambulatory Visit (INDEPENDENT_AMBULATORY_CARE_PROVIDER_SITE_OTHER): Payer: 59

## 2020-04-06 ENCOUNTER — Other Ambulatory Visit: Payer: Self-pay

## 2020-04-06 LAB — COMPREHENSIVE METABOLIC PANEL
ALT: 10 U/L (ref 0–35)
AST: 11 U/L (ref 0–37)
Albumin: 4.2 g/dL (ref 3.5–5.2)
Alkaline Phosphatase: 81 U/L (ref 39–117)
BUN: 14 mg/dL (ref 6–23)
CO2: 26 mEq/L (ref 19–32)
Calcium: 10.1 mg/dL (ref 8.4–10.5)
Chloride: 111 mEq/L (ref 96–112)
Creatinine, Ser: 0.76 mg/dL (ref 0.40–1.20)
GFR: 78.27 mL/min (ref >60.00)
Glucose, Bld: 104 mg/dL — ABNORMAL HIGH (ref 70–99)
Potassium: 4.2 mEq/L (ref 3.5–5.1)
Sodium: 142 mEq/L (ref 135–145)
Total Bilirubin: 0.9 mg/dL (ref 0.2–1.2)
Total Protein: 6.1 g/dL (ref 6.0–8.3)

## 2020-04-07 LAB — CALCIUM, IONIZED: Calcium, Ion: 5.8 mg/dL — ABNORMAL HIGH (ref 4.8–5.6)

## 2020-04-07 LAB — PARATHYROID HORMONE, INTACT (NO CA): PTH: 86 pg/mL — ABNORMAL HIGH (ref 14–64)

## 2020-04-12 ENCOUNTER — Other Ambulatory Visit: Payer: Self-pay | Admitting: *Deleted

## 2020-04-14 ENCOUNTER — Ambulatory Visit (AMBULATORY_SURGERY_CENTER): Payer: Self-pay | Admitting: *Deleted

## 2020-04-14 ENCOUNTER — Other Ambulatory Visit: Payer: Self-pay

## 2020-04-14 VITALS — Temp 97.7°F | Ht 65.0 in | Wt 174.0 lb

## 2020-04-14 DIAGNOSIS — Z86010 Personal history of colonic polyps: Secondary | ICD-10-CM

## 2020-04-14 MED ORDER — SUPREP BOWEL PREP KIT 17.5-3.13-1.6 GM/177ML PO SOLN: 1.0000 | Freq: Once | ORAL | 0 refills | Status: AC

## 2020-04-14 NOTE — Progress Notes (Signed)
04-04-2020 completed covid vaccines   No egg or soy allergy known to patient  No issues with past sedation with any surgeries  or procedures, no intubation problems  No diet pills per patient No home 02 use per patient  No blood thinners per patient  Pt denies issues with constipation - uses Miralax daily with soft regular stools  No A fib or A flutter  EMMI video sent to pt's e mail   Due to the COVID-19 pandemic we are asking patients to follow these guidelines. Please only bring one care partner. Please be aware that your care partner may wait in the car in the parking lot or if they feel like they will be too hot to wait in the car, they may wait in the lobby on the 4th floor. All care partners are required to wear a mask the entire time (we do not have any that we can provide them), they need to practice social distancing, and we will do a Covid check for all patient's and care partners when you arrive. Also we will check their temperature and your temperature. If the care partner waits in their car they need to stay in the parking lot the entire time and we will call them on their cell phone when the patient is ready for discharge so they can bring the car to the front of the building. Also all patient's will need to wear a mask into building.  Suprep Code to pharmacy  And COupon to pt

## 2020-04-28 ENCOUNTER — Other Ambulatory Visit: Payer: Self-pay

## 2020-04-28 ENCOUNTER — Encounter: Payer: Self-pay | Admitting: Gastroenterology

## 2020-04-28 ENCOUNTER — Ambulatory Visit (AMBULATORY_SURGERY_CENTER): Payer: 59 | Admitting: Gastroenterology

## 2020-04-28 VITALS — BP 114/80 | HR 66 | Temp 96.8°F | Resp 20 | Ht 65.0 in | Wt 174.0 lb

## 2020-04-28 DIAGNOSIS — D123 Benign neoplasm of transverse colon: Secondary | ICD-10-CM

## 2020-04-28 DIAGNOSIS — K635 Polyp of colon: Secondary | ICD-10-CM | POA: Diagnosis not present

## 2020-04-28 DIAGNOSIS — Z86010 Personal history of colonic polyps: Secondary | ICD-10-CM

## 2020-04-28 HISTORY — PX: COLONOSCOPY: SHX174

## 2020-04-28 MED ORDER — SODIUM CHLORIDE 0.9 % IV SOLN: 500.0000 mL | Freq: Once | INTRAVENOUS | Status: DC

## 2020-04-28 NOTE — Patient Instructions (Signed)
YOU HAD AN ENDOSCOPIC PROCEDURE TODAY AT THE  ENDOSCOPY CENTER:   Refer to the procedure report that was given to you for any specific questions about what was found during the examination.  If the procedure report does not answer your questions, please call your gastroenterologist to clarify.  If you requested that your care partner not be given the details of your procedure findings, then the procedure report has been included in a sealed envelope for you to review at your convenience later.  YOU SHOULD EXPECT: Some feelings of bloating in the abdomen. Passage of more gas than usual.  Walking can help get rid of the air that was put into your GI tract during the procedure and reduce the bloating. If you had a lower endoscopy (such as a colonoscopy or flexible sigmoidoscopy) you may notice spotting of blood in your stool or on the toilet paper. If you underwent a bowel prep for your procedure, you may not have a normal bowel movement for a few days.  Please Note:  You might notice some irritation and congestion in your nose or some drainage.  This is from the oxygen used during your procedure.  There is no need for concern and it should clear up in a day or so.  SYMPTOMS TO REPORT IMMEDIATELY:   Following lower endoscopy (colonoscopy or flexible sigmoidoscopy):  Excessive amounts of blood in the stool  Significant tenderness or worsening of abdominal pains  Swelling of the abdomen that is new, acute  Fever of 100F or higher   For urgent or emergent issues, a gastroenterologist can be reached at any hour by calling [phone]. Do not use MyChart messaging for urgent concerns.    DIET:  We do recommend a small meal at first, but then you may proceed to your regular diet.  Drink plenty of fluids but you should avoid alcoholic beverages for 24 hours.  MEDICATIONS: Continue present medications.  Please see handouts given to you by your recovery nurse.  ACTIVITY:  You should plan to  take it easy for the rest of today and you should NOT DRIVE or use heavy machinery until tomorrow (because of the sedation medicines used during the test).    FOLLOW UP: Our staff will call the number listed on your records 48-72 hours following your procedure to check on you and address any questions or concerns that you may have regarding the information given to you following your procedure. If we do not reach you, we will leave a message.  We will attempt to reach you two times.  During this call, we will ask if you have developed any symptoms of COVID 19. If you develop any symptoms (ie: fever, flu-like symptoms, shortness of breath, cough etc.) before then, please call [phone].  If you test positive for Covid 19 in the 2 weeks post procedure, please call and report this information to us.    If any biopsies were taken you will be contacted by phone or by letter within the next 1-3 weeks.  Please call us at [phone] if you have not heard about the biopsies in 3 weeks.   Thank you for allowing us to provide for your healthcare needs today.   SIGNATURES/CONFIDENTIALITY: You and/or your care partner have signed paperwork which will be entered into your electronic medical record.  These signatures attest to the fact that that the information above on your After Visit Summary has been reviewed and is understood.  Full responsibility of the   confidentiality of this discharge information lies with you and/or your care-partner. 

## 2020-04-28 NOTE — Progress Notes (Signed)
Called to room to assist during endoscopic procedure.  Patient ID and intended procedure confirmed with present staff. Received instructions for my participation in the procedure from the performing physician.  

## 2020-04-28 NOTE — Op Note (Signed)
[facility] Patient Name: Kristen Dunlap Procedure Date: 04/28/2020 8:50 AM MRN: [id number] Endoscopist: Milus Banister , MD Age: 59 Referring MD:  Date of Birth: Dec 14, 1961 Gender: Female Account #: [id number] Procedure:                Colonoscopy Indications:              High risk colon cancer surveillance: Personal                            history of colonic polyps; Colonoscopy 2016 single                            subCM adenoma Medicines:                Monitored Anesthesia Care Procedure:                Pre-Anesthesia Assessment:                           - Prior to the procedure, a History and Physical                            was performed, and patient medications and                            allergies were reviewed. The patient's tolerance of                            previous anesthesia was also reviewed. The risks                            and benefits of the procedure and the sedation                            options and risks were discussed with the patient.                            All questions were answered, and informed consent                            was obtained. Prior Anticoagulants: The patient has                            taken no previous anticoagulant or antiplatelet                            agents. ASA Grade Assessment: II - A patient with                            mild systemic disease. After reviewing the risks                            and benefits, the patient was deemed in  satisfactory condition to undergo the procedure.                           After obtaining informed consent, the colonoscope                            was passed under direct vision. Throughout the                            procedure, the patient's blood pressure, pulse, and                            oxygen saturations were monitored continuously. The                            Colonoscope was introduced through the anus and                             advanced to the the cecum, identified by                            appendiceal orifice and ileocecal valve. The                            colonoscopy was performed without difficulty. The                            patient tolerated the procedure well. The quality                            of the bowel preparation was good. The ileocecal                            valve, appendiceal orifice, and rectum were                            photographed. Scope In: 9:04:39 AM Scope Out: 9:18:02 AM Scope Withdrawal Time: 0 hours 9 minutes 14 seconds  Total Procedure Duration: 0 hours 13 minutes 23 seconds  Findings:                 A 2 mm polyp was found in the transverse colon. The                            polyp was sessile. The polyp was removed with a                            cold snare. Resection and retrieval were complete.                           Internal hemorrhoids were found. The hemorrhoids                            were medium-sized.  The exam was otherwise without abnormality on                            direct and retroflexion views. Complications:            No immediate complications. Estimated blood loss:                            None. Estimated Blood Loss:     Estimated blood loss: none. Impression:               - One 2 mm polyp in the transverse colon, removed                            with a cold snare. Resected and retrieved.                           - Internal hemorrhoids.                           - The examination was otherwise normal on direct                            and retroflexion views. Recommendation:           - Patient has a contact number available for                            emergencies. The signs and symptoms of potential                            delayed complications were discussed with the                            patient. Return to normal activities tomorrow.                             Written discharge instructions were provided to the                            patient.                           - Resume previous diet.                           - Continue present medications.                           - Await pathology results. Milus Banister, MD 04/28/2020 9:20:39 AM This report has been signed electronically.

## 2020-04-28 NOTE — Progress Notes (Signed)
Pt's states no medical or surgical changes since previsit or office visit.  Temp JB Vitals CW 

## 2020-04-28 NOTE — Progress Notes (Signed)
Report given to PACU, vss 

## 2020-05-02 ENCOUNTER — Encounter: Payer: Self-pay | Admitting: Gastroenterology

## 2020-05-02 ENCOUNTER — Telehealth: Payer: Self-pay

## 2020-05-02 NOTE — Telephone Encounter (Signed)
  Follow up Call-  Call back number 04/28/2020  Post procedure Call Back phone  # [phone]  Permission to leave phone message Yes  Some recent data might be hidden     Patient questions:  Do you have a fever, pain , or abdominal swelling? No. Pain Score  0 *  Have you tolerated food without any problems? Yes.    Have you been able to return to your normal activities? Yes.    Do you have any questions about your discharge instructions: Diet   No. Medications  No. Follow up visit  No.  Do you have questions or concerns about your Care? No.  Actions: * If pain score is 4 or above: No action needed, pain <4.  1. Have you developed a fever since your procedure? No  2.   Have you had an respiratory symptoms (SOB or cough) since your procedure? No  3.   Have you tested positive for COVID 19 since your procedure No  4.   Have you had any family members/close contacts diagnosed with the COVID 19 since your procedure? No  If yes to any of these questions please route to Joylene John, RN and Erenest Rasher, RN

## 2020-07-10 ENCOUNTER — Other Ambulatory Visit (INDEPENDENT_AMBULATORY_CARE_PROVIDER_SITE_OTHER): Payer: 59

## 2020-07-10 ENCOUNTER — Other Ambulatory Visit: Payer: Self-pay

## 2020-07-11 LAB — PTH, INTACT AND CALCIUM
Calcium: 9.9 mg/dL (ref 8.6–10.4)
PTH: 76 pg/mL — ABNORMAL HIGH (ref 14–64)

## 2020-11-22 LAB — HM MAMMOGRAPHY

## 2021-03-01 ENCOUNTER — Ambulatory Visit: Payer: 59 | Admitting: Family Medicine

## 2021-03-01 ENCOUNTER — Other Ambulatory Visit (HOSPITAL_COMMUNITY)
Admission: RE | Admit: 2021-03-01 | Discharge: 2021-03-01 | Disposition: A | Discharge status: Home / Self Care | Payer: 59 | Source: Ambulatory Visit | Attending: Family Medicine | Admitting: Family Medicine

## 2021-03-01 ENCOUNTER — Encounter: Payer: Self-pay | Admitting: Family Medicine

## 2021-03-01 ENCOUNTER — Other Ambulatory Visit: Payer: Self-pay

## 2021-03-01 VITALS — BP 110/66 | HR 74 | Temp 98.3°F | Resp 16 | Ht 65.0 in | Wt 177.8 lb

## 2021-03-01 DIAGNOSIS — Z0000 Encounter for general adult medical examination without abnormal findings: Secondary | ICD-10-CM | POA: Diagnosis not present

## 2021-03-01 DIAGNOSIS — Z124 Encounter for screening for malignant neoplasm of cervix: Secondary | ICD-10-CM | POA: Insufficient documentation

## 2021-03-01 DIAGNOSIS — E663 Overweight: Secondary | ICD-10-CM

## 2021-03-01 DIAGNOSIS — E782 Mixed hyperlipidemia: Secondary | ICD-10-CM

## 2021-03-01 LAB — COMPREHENSIVE METABOLIC PANEL
ALT: 11 U/L (ref 0–35)
AST: 14 U/L (ref 0–37)
Albumin: 4.4 g/dL (ref 3.5–5.2)
Alkaline Phosphatase: 98 U/L (ref 39–117)
BUN: 19 mg/dL (ref 6–23)
CO2: 29 mEq/L (ref 19–32)
Calcium: 10.5 mg/dL (ref 8.4–10.5)
Chloride: 106 mEq/L (ref 96–112)
Creatinine, Ser: 0.73 mg/dL (ref 0.40–1.20)
GFR: 90.59 mL/min (ref >60.00)
Glucose, Bld: 90 mg/dL (ref 70–99)
Potassium: 4.2 mEq/L (ref 3.5–5.1)
Sodium: 139 mEq/L (ref 135–145)
Total Bilirubin: 0.9 mg/dL (ref 0.2–1.2)
Total Protein: 6.7 g/dL (ref 6.0–8.3)

## 2021-03-01 LAB — CBC WITH DIFFERENTIAL/PLATELET
Basophils Absolute: 0.1 10*3/uL (ref 0.0–0.1)
Basophils Relative: 1.3 % (ref 0.0–3.0)
Eosinophils Absolute: 0.1 10*3/uL (ref 0.0–0.7)
Eosinophils Relative: 1.9 % (ref 0.0–5.0)
HCT: 38.3 % (ref 36.0–46.0)
Hemoglobin: 12.7 g/dL (ref 12.0–15.0)
Lymphocytes Relative: 38.5 % (ref 12.0–46.0)
Lymphs Abs: 1.8 10*3/uL (ref 0.7–4.0)
MCHC: 33.2 g/dL (ref 30.0–36.0)
MCV: 86.8 fl (ref 78.0–100.0)
Monocytes Absolute: 0.4 10*3/uL (ref 0.1–1.0)
Monocytes Relative: 8 % (ref 3.0–12.0)
Neutro Abs: 2.4 10*3/uL (ref 1.4–7.7)
Neutrophils Relative %: 50.3 % (ref 43.0–77.0)
Platelets: 301 10*3/uL (ref 150.0–400.0)
RBC: 4.41 Mil/uL (ref 3.87–5.11)
RDW: 13.8 % (ref 11.5–15.5)
WBC: 4.8 10*3/uL (ref 4.0–10.5)

## 2021-03-01 LAB — LIPID PANEL
Cholesterol: 218 mg/dL — ABNORMAL HIGH (ref 0–200)
HDL: 74.7 mg/dL (ref >39.00)
LDL Cholesterol: 127 mg/dL — ABNORMAL HIGH (ref 0–99)
NonHDL: 143.4
Total CHOL/HDL Ratio: 3
Triglycerides: 83 mg/dL (ref 0.0–149.0)
VLDL: 16.6 mg/dL (ref 0.0–40.0)

## 2021-03-01 LAB — TSH: TSH: 3.7 u[IU]/mL (ref 0.35–4.50)

## 2021-03-01 NOTE — Patient Instructions (Signed)
Preventive Care 84-59 Years Old, Female Preventive care refers to lifestyle choices and visits with your health care provider that can promote health and wellness. This includes:  A yearly physical exam. This is also called an annual wellness visit.  Regular dental and eye exams.  Immunizations.  Screening for certain conditions.  Healthy lifestyle choices, such as: ? Eating a healthy diet. ? Getting regular exercise. ? Not using drugs or products that contain nicotine and tobacco. ? Limiting alcohol use. What can I expect for my preventive care visit? Physical exam Your health care provider will check your:  Height and weight. These may be used to calculate your BMI (body mass index). BMI is a measurement that tells if you are at a healthy weight.  Heart rate and blood pressure.  Body temperature.  Skin for abnormal spots. Counseling Your health care provider may ask you questions about your:  Past medical problems.  Family's medical history.  Alcohol, tobacco, and drug use.  Emotional well-being.  Home life and relationship well-being.  Sexual activity.  Diet, exercise, and sleep habits.  Work and work Statistician.  Access to firearms.  Method of birth control.  Menstrual cycle.  Pregnancy history. What immunizations do I need? Vaccines are usually given at various ages, according to a schedule. Your health care provider will recommend vaccines for you based on your age, medical history, and lifestyle or other factors, such as travel or where you work.   What tests do I need? Blood tests  Lipid and cholesterol levels. These may be checked every 5 years, or more often if you are over 3 years old.  Hepatitis C test.  Hepatitis B test. Screening  Lung cancer screening. You may have this screening every year starting at age 73 if you have a 30-pack-year history of smoking and currently smoke or have quit within the past 15 years.  Colorectal cancer  screening. ? All adults should have this screening starting at age 52 and continuing until age 17. ? Your health care provider may recommend screening at age 49 if you are at increased risk. ? You will have tests every 1-10 years, depending on your results and the type of screening test.  Diabetes screening. ? This is done by checking your blood sugar (glucose) after you have not eaten for a while (fasting). ? You may have this done every 1-3 years.  Mammogram. ? This may be done every 1-2 years. ? Talk with your health care provider about when you should start having regular mammograms. This may depend on whether you have a family history of breast cancer.  BRCA-related cancer screening. This may be done if you have a family history of breast, ovarian, tubal, or peritoneal cancers.  Pelvic exam and Pap test. ? This may be done every 3 years starting at age 10. ? Starting at age 11, this may be done every 5 years if you have a Pap test in combination with an HPV test. Other tests  STD (sexually transmitted disease) testing, if you are at risk.  Bone density scan. This is done to screen for osteoporosis. You may have this scan if you are at high risk for osteoporosis. Talk with your health care provider about your test results, treatment options, and if necessary, the need for more tests. Follow these instructions at home: Eating and drinking  Eat a diet that includes fresh fruits and vegetables, whole grains, lean protein, and low-fat dairy products.  Take vitamin and mineral supplements  as recommended by your health care provider.  Do not drink alcohol if: ? Your health care provider tells you not to drink. ? You are pregnant, may be pregnant, or are planning to become pregnant.  If you drink alcohol: ? Limit how much you have to 0-1 drink a day. ? Be aware of how much alcohol is in your drink. In the [place] one drink equals one 12 oz bottle of beer (355 mL), one 5 oz glass of  wine (148 mL), or one 1 oz glass of hard liquor (44 mL).   Lifestyle  Take daily care of your teeth and gums. Brush your teeth every morning and night with fluoride toothpaste. Floss one time each day.  Stay active. Exercise for at least 30 minutes 5 or more days each week.  Do not use any products that contain nicotine or tobacco, such as cigarettes, e-cigarettes, and chewing tobacco. If you need help quitting, ask your health care provider.  Do not use drugs.  If you are sexually active, practice safe sex. Use a condom or other form of protection to prevent STIs (sexually transmitted infections).  If you do not wish to become pregnant, use a form of birth control. If you plan to become pregnant, see your health care provider for a prepregnancy visit.  If told by your health care provider, take low-dose aspirin daily starting at age 50.  Find healthy ways to cope with stress, such as: ? Meditation, yoga, or listening to music. ? Journaling. ? Talking to a trusted person. ? Spending time with friends and family. Safety  Always wear your seat belt while driving or riding in a vehicle.  Do not drive: ? If you have been drinking alcohol. Do not ride with someone who has been drinking. ? When you are tired or distracted. ? While texting.  Wear a helmet and other protective equipment during sports activities.  If you have firearms in your house, make sure you follow all gun safety procedures. What's next?  Visit your health care provider once a year for an annual wellness visit.  Ask your health care provider how often you should have your eyes and teeth checked.  Stay up to date on all vaccines. This information is not intended to replace advice given to you by your health care provider. Make sure you discuss any questions you have with your health care provider. Document Revised: 09/19/2020 Document Reviewed: 08/27/2018 Elsevier Patient Education  2021 Elsevier Inc.  

## 2021-03-04 NOTE — Assessment & Plan Note (Addendum)
Patient encouraged to maintain heart healthy diet, regular exercise, adequate sleep. Consider daily probiotics. Take medications as prescribed. Labs ordered and reviewed. Colonoscopy was in 2021 next one due in 2031. Pap completed today, MGM 10/2020 repeat every 1-2 years. Immunizations updated in chart

## 2021-03-04 NOTE — Progress Notes (Signed)
Subjective:    Patient ID: Kristen Dunlap, female    DOB: [birth year], 60 y.o.   MRN: [id number]  Chief Complaint  Patient presents with  . Annual Exam    HPI Patient is in today for annual preventative exam and folllow up on chronic medical concerns. No recent febrile illness or hospitalizations. She is frustrated with her weight gain but she is trying to stay active and maintain a heart healthy diet. Denies CP/palp/SOB/HA/congestion/fevers/GI or GU c/o. Taking meds as prescribed  Past Medical History:  Diagnosis Date  . Anemia 11/09/2015  . Chronic kidney disease    kidney stones   . Fever blister 03/09/2013  . Heel spur    right  . Hx of cold sores 03/09/2013  . Other and unspecified hyperlipidemia 03/14/2013  . Overweight(278.02)   . Personal history of kidney stones   . Postmenopausal   . Postmenopausal bleeding 10/01/2011    Past Surgical History:  Procedure Laterality Date  . [place]   X 2  . COLONOSCOPY  04/28/2020  . [place]  . HYSTEROSCOPY  11/29/2011   Procedure: HYSTEROSCOPY;  Surgeon: Terrance Mass, MD;  Location: [facility];  Service: Gynecology;  Laterality: N/A;  with Myosure  . POLYPECTOMY    . TONSILLECTOMY  AS CHILD  . TUBAL LIGATION  15 YRS AGO    Family History  Problem Relation Age of Onset  . Diabetes Father   . Cancer Father        throat ca/ smoker  . Dementia Father   . Esophageal cancer Father   . Cancer Paternal Grandmother        esophagus, cancer  . Other Sister 62       PLS  . Early death Sister   . Colon cancer Neg Hx   . Colon polyps Neg Hx   . Rectal cancer Neg Hx   . Stomach cancer Neg Hx     Social History   Socioeconomic History  . Marital status: Married    Spouse name: Not on file  . Number of children: Not on file  . Years of education: Not on file  . Highest education level: Not on file  Occupational History  . Not on file  Tobacco Use  . Smoking  status: Never Smoker  . Smokeless tobacco: Never Used  Vaping Use  . Vaping Use: Never used  Substance and Sexual Activity  . Alcohol use: Yes    Comment: occasionaly  . Drug use: No  . Sexual activity: Yes    Partners: Male    Comment: tubes tied, lives with husband, works in office setting, accounting, no dietary restrictions  Other Topics Concern  . Not on file  Social History Narrative  . Not on file   Social Determinants of Health   Financial Resource Strain: Not on file  Food Insecurity: Not on file  Transportation Needs: Not on file  Physical Activity: Not on file  Stress: Not on file  Social Connections: Not on file  Intimate Partner Violence: Not on file    Outpatient Medications Prior to Visit  Medication Sig Dispense Refill  . KRILL OIL PO Take by mouth.    . Multiple Vitamin (MULTIVITAMIN WITH MINERALS) TABS tablet Take 1 tablet by mouth daily.    . polyethylene glycol (MIRALAX / GLYCOLAX) packet Take 17 g by mouth daily. Mix in 8 oz liquid and drink  Facility-Administered Medications Prior to Visit  Medication Dose Route Frequency Provider Last Rate Last Admin  . 0.9 %  sodium chloride infusion  500 mL Intravenous Once Milus Banister, MD        Allergies  Allergen Reactions  . Chlorhexidine Rash    Review of Systems  Constitutional: Negative for chills, fever and malaise/fatigue.  HENT: Negative for congestion and hearing loss.   Eyes: Negative for discharge.  Respiratory: Negative for cough, sputum production and shortness of breath.   Cardiovascular: Negative for chest pain, palpitations and leg swelling.  Gastrointestinal: Negative for abdominal pain, blood in stool, constipation, diarrhea, heartburn, nausea and vomiting.  Genitourinary: Negative for dysuria, frequency, hematuria and urgency.  Musculoskeletal: Negative for back pain, falls and myalgias.  Skin: Negative for rash.  Neurological: Negative for dizziness, sensory change, loss of  consciousness, weakness and headaches.  Endo/Heme/Allergies: Negative for environmental allergies. Does not bruise/bleed easily.  Psychiatric/Behavioral: Negative for depression and suicidal ideas. The patient is not nervous/anxious and does not have insomnia.        Objective:    Physical Exam Constitutional:      General: She is not in acute distress.    Appearance: She is well-developed and well-nourished.  HENT:     Head: Normocephalic and atraumatic.  Eyes:     Conjunctiva/sclera: Conjunctivae normal.  Neck:     Thyroid: No thyromegaly.  Cardiovascular:     Rate and Rhythm: Normal rate and regular rhythm.     Heart sounds: Normal heart sounds. No murmur heard.   Pulmonary:     Effort: Pulmonary effort is normal. No respiratory distress.     Breath sounds: Normal breath sounds.  Abdominal:     General: Bowel sounds are normal. There is no distension.     Palpations: Abdomen is soft. There is no mass.     Tenderness: There is no abdominal tenderness.  Musculoskeletal:        General: No edema.     Cervical back: Neck supple.  Lymphadenopathy:     Cervical: No cervical adenopathy.  Skin:    General: Skin is warm and dry.  Neurological:     Mental Status: She is alert and oriented to person, place, and time.  Psychiatric:        Mood and Affect: Mood and affect normal.        Behavior: Behavior normal.     BP 110/66   Pulse 74   Temp 98.3 F (36.8 C)   Resp 16   Ht 5\' 5"  (1.651 m)   Wt 177 lb 12.8 oz (80.6 kg)   LMP 11/28/2009   SpO2 97%   BMI 29.59 kg/m  Wt Readings from Last 3 Encounters:  03/01/21 177 lb 12.8 oz (80.6 kg)  04/28/20 174 lb (78.9 kg)  04/14/20 174 lb (78.9 kg)    Diabetic Foot Exam - Simple   No data filed    Lab Results  Component Value Date   WBC 4.8 03/01/2021   HGB 12.7 03/01/2021   HCT 38.3 03/01/2021   PLT 301.0 03/01/2021   GLUCOSE 90 03/01/2021   CHOL 218 (H) 03/01/2021   TRIG 83.0 03/01/2021   HDL 74.70 03/01/2021    LDLDIRECT 115.0 03/25/2011   LDLCALC 127 (H) 03/01/2021   ALT 11 03/01/2021   AST 14 03/01/2021   NA 139 03/01/2021   K 4.2 03/01/2021   CL 106 03/01/2021   CREATININE 0.73 03/01/2021   BUN 19 03/01/2021  CO2 29 03/01/2021   TSH 3.70 03/01/2021   INR 0.87 09/30/2011    Lab Results  Component Value Date   TSH 3.70 03/01/2021   Lab Results  Component Value Date   WBC 4.8 03/01/2021   HGB 12.7 03/01/2021   HCT 38.3 03/01/2021   MCV 86.8 03/01/2021   PLT 301.0 03/01/2021   Lab Results  Component Value Date   NA 139 03/01/2021   K 4.2 03/01/2021   CO2 29 03/01/2021   GLUCOSE 90 03/01/2021   BUN 19 03/01/2021   CREATININE 0.73 03/01/2021   BILITOT 0.9 03/01/2021   ALKPHOS 98 03/01/2021   AST 14 03/01/2021   ALT 11 03/01/2021   PROT 6.7 03/01/2021   ALBUMIN 4.4 03/01/2021   CALCIUM 10.5 03/01/2021   ANIONGAP 6 08/02/2017   GFR 90.59 03/01/2021   Lab Results  Component Value Date   CHOL 218 (H) 03/01/2021   Lab Results  Component Value Date   HDL 74.70 03/01/2021   Lab Results  Component Value Date   LDLCALC 127 (H) 03/01/2021   Lab Results  Component Value Date   TRIG 83.0 03/01/2021   Lab Results  Component Value Date   CHOLHDL 3 03/01/2021   No results found for: HGBA1C     Assessment & Plan:   Problem List Items Addressed This Visit    Overweight    Encouraged MIND diet, decrease po intake and increase exercise as tolerated. Needs 7-8 hours of sleep nightly. Avoid trans fats, eat small, frequent meals every 4-5 hours with lean proteins, complex carbs and healthy fats. Minimize simple carbs      Preventative health care - Primary    Patient encouraged to maintain heart healthy diet, regular exercise, adequate sleep. Consider daily probiotics. Take medications as prescribed. Labs ordered and reviewed. Colonoscopy was in 2021 next one due in 2031. Pap completed today, MGM 10/2020 repeat every 1-2 years. Immunizations updated in chart       Relevant Orders   CBC with Differential/Platelet (Completed)   Comprehensive metabolic panel (Completed)   Lipid panel (Completed)   TSH (Completed)   Cytology - PAP( [place])   Hyperlipidemia    Encouraged heart healthy diet, increase exercise, avoid trans fats, consider a krill oil cap daily      Cervical cancer screening    Pap today, no concerns on exam.       Relevant Orders   Cytology - PAP( [place])      I am having Kristen Dunlap maintain her multivitamin with minerals, polyethylene glycol, and KRILL OIL PO. We will continue to administer sodium chloride.  No orders of the defined types were placed in this encounter.    Penni Homans, MD

## 2021-03-04 NOTE — Assessment & Plan Note (Signed)
Encouraged MIND diet, decrease po intake and increase exercise as tolerated. Needs 7-8 hours of sleep nightly. Avoid trans fats, eat small, frequent meals every 4-5 hours with lean proteins, complex carbs and healthy fats. Minimize simple carbs

## 2021-03-04 NOTE — Assessment & Plan Note (Signed)
Pap today, no concerns on exam.  

## 2021-03-04 NOTE — Assessment & Plan Note (Signed)
Encouraged heart healthy diet, increase exercise, avoid trans fats, consider a krill oil cap daily 

## 2021-03-05 LAB — CYTOLOGY - PAP: Diagnosis: NEGATIVE

## 2022-03-01 LAB — HM MAMMOGRAPHY: HM Mammogram: NORMAL (ref 0–4)

## 2022-03-04 ENCOUNTER — Encounter: Payer: 59 | Admitting: Family Medicine

## 2022-09-12 ENCOUNTER — Ambulatory Visit (INDEPENDENT_AMBULATORY_CARE_PROVIDER_SITE_OTHER): Payer: 59 | Admitting: Family Medicine

## 2022-09-12 VITALS — BP 115/64 | HR 64 | Temp 98.0°F | Resp 16 | Ht 66.0 in | Wt 177.6 lb

## 2022-09-12 DIAGNOSIS — Z0000 Encounter for general adult medical examination without abnormal findings: Secondary | ICD-10-CM

## 2022-09-12 DIAGNOSIS — Z23 Encounter for immunization: Secondary | ICD-10-CM

## 2022-09-12 DIAGNOSIS — E2839 Other primary ovarian failure: Secondary | ICD-10-CM

## 2022-09-12 DIAGNOSIS — Z780 Asymptomatic menopausal state: Secondary | ICD-10-CM

## 2022-09-12 DIAGNOSIS — I4891 Unspecified atrial fibrillation: Secondary | ICD-10-CM

## 2022-09-12 DIAGNOSIS — D649 Anemia, unspecified: Secondary | ICD-10-CM | POA: Diagnosis not present

## 2022-09-12 DIAGNOSIS — E782 Mixed hyperlipidemia: Secondary | ICD-10-CM

## 2022-09-12 LAB — COMPREHENSIVE METABOLIC PANEL
ALT: 10 U/L (ref 0–35)
AST: 11 U/L (ref 0–37)
Albumin: 4.2 g/dL (ref 3.5–5.2)
Alkaline Phosphatase: 92 U/L (ref 39–117)
BUN: 13 mg/dL (ref 6–23)
CO2: 28 mEq/L (ref 19–32)
Calcium: 11 mg/dL — ABNORMAL HIGH (ref 8.4–10.5)
Chloride: 108 mEq/L (ref 96–112)
Creatinine, Ser: 0.84 mg/dL (ref 0.40–1.20)
GFR: 75.73 mL/min (ref >60.00)
Glucose, Bld: 87 mg/dL (ref 70–99)
Potassium: 4.6 mEq/L (ref 3.5–5.1)
Sodium: 142 mEq/L (ref 135–145)
Total Bilirubin: 0.8 mg/dL (ref 0.2–1.2)
Total Protein: 6.8 g/dL (ref 6.0–8.3)

## 2022-09-12 LAB — LIPID PANEL
Cholesterol: 220 mg/dL — ABNORMAL HIGH (ref 0–200)
HDL: 67.6 mg/dL (ref >39.00)
LDL Cholesterol: 129 mg/dL — ABNORMAL HIGH (ref 0–99)
NonHDL: 152.88
Total CHOL/HDL Ratio: 3
Triglycerides: 117 mg/dL (ref 0.0–149.0)
VLDL: 23.4 mg/dL (ref 0.0–40.0)

## 2022-09-12 LAB — TSH: TSH: 3.95 u[IU]/mL (ref 0.35–5.50)

## 2022-09-12 LAB — CBC
HCT: 40.3 % (ref 36.0–46.0)
Hemoglobin: 13.4 g/dL (ref 12.0–15.0)
MCHC: 33.1 g/dL (ref 30.0–36.0)
MCV: 88.9 fl (ref 78.0–100.0)
Platelets: 303 10*3/uL (ref 150.0–400.0)
RBC: 4.54 Mil/uL (ref 3.87–5.11)
RDW: 14.1 % (ref 11.5–15.5)
WBC: 5.5 10*3/uL (ref 4.0–10.5)

## 2022-09-12 NOTE — Assessment & Plan Note (Signed)
Patient encouraged to maintain heart healthy diet, regular exercise, adequate sleep. Consider daily probiotics. Take medications as prescribed. Labs ordered and reviewed. Flu shot given today. Normal MGM at [facility] in March 2023. Colonoscopy 2021 repeat in 2031. Pap March 2022 repeat in 2025

## 2022-09-12 NOTE — Patient Instructions (Signed)
MIND or PURE diet   Altha Harm do not drink   Covid booster when new version out late September At pharmacy  flu shot mid Sept to mid Oct   Preventive Care 24-60 Years Old, Female Preventive care refers to lifestyle choices and visits with your health care provider that can promote health and wellness. Preventive care visits are also called wellness exams. What can I expect for my preventive care visit? Counseling Your health care provider may ask you questions about your: Medical history, including: Past medical problems. Family medical history. Pregnancy history. Current health, including: Menstrual cycle. Method of birth control. Emotional well-being. Home life and relationship well-being. Sexual activity and sexual health. Lifestyle, including: Alcohol, nicotine or tobacco, and drug use. Access to firearms. Diet, exercise, and sleep habits. Work and work Statistician. Sunscreen use. Safety issues such as seatbelt and bike helmet use. Physical exam Your health care provider will check your: Height and weight. These may be used to calculate your BMI (body mass index). BMI is a measurement that tells if you are at a healthy weight. Waist circumference. This measures the distance around your waistline. This measurement also tells if you are at a healthy weight and may help predict your risk of certain diseases, such as type 2 diabetes and high blood pressure. Heart rate and blood pressure. Body temperature. Skin for abnormal spots. What immunizations do I need?  Vaccines are usually given at various ages, according to a schedule. Your health care provider will recommend vaccines for you based on your age, medical history, and lifestyle or other factors, such as travel or where you work. What tests do I need? Screening Your health care provider may recommend screening tests for certain conditions. This may include: Lipid and cholesterol levels. Diabetes screening. This is  done by checking your blood sugar (glucose) after you have not eaten for a while (fasting). Pelvic exam and Pap test. Hepatitis B test. Hepatitis C test. HIV (human immunodeficiency virus) test. STI (sexually transmitted infection) testing, if you are at risk. Lung cancer screening. Colorectal cancer screening. Mammogram. Talk with your health care provider about when you should start having regular mammograms. This may depend on whether you have a family history of breast cancer. BRCA-related cancer screening. This may be done if you have a family history of breast, ovarian, tubal, or peritoneal cancers. Bone density scan. This is done to screen for osteoporosis. Talk with your health care provider about your test results, treatment options, and if necessary, the need for more tests. Follow these instructions at home: Eating and drinking  Eat a diet that includes fresh fruits and vegetables, whole grains, lean protein, and low-fat dairy products. Take vitamin and mineral supplements as recommended by your health care provider. Do not drink alcohol if: Your health care provider tells you not to drink. You are pregnant, may be pregnant, or are planning to become pregnant. If you drink alcohol: Limit how much you have to 0-1 drink a day. Know how much alcohol is in your drink. In the [place] one drink equals one 12 oz bottle of beer (355 mL), one 5 oz glass of wine (148 mL), or one 1 oz glass of hard liquor (44 mL). Lifestyle Brush your teeth every morning and night with fluoride toothpaste. Floss one time each day. Exercise for at least 30 minutes 5 or more days each week. Do not use any products that contain nicotine or tobacco. These products include cigarettes, chewing tobacco, and vaping devices, such  as e-cigarettes. If you need help quitting, ask your health care provider. Do not use drugs. If you are sexually active, practice safe sex. Use a condom or other form of protection to  prevent STIs. If you do not wish to become pregnant, use a form of birth control. If you plan to become pregnant, see your health care provider for a prepregnancy visit. Take aspirin only as told by your health care provider. Make sure that you understand how much to take and what form to take. Work with your health care provider to find out whether it is safe and beneficial for you to take aspirin daily. Find healthy ways to manage stress, such as: Meditation, yoga, or listening to music. Journaling. Talking to a trusted person. Spending time with friends and family. Minimize exposure to UV radiation to reduce your risk of skin cancer. Safety Always wear your seat belt while driving or riding in a vehicle. Do not drive: If you have been drinking alcohol. Do not ride with someone who has been drinking. When you are tired or distracted. While texting. If you have been using any mind-altering substances or drugs. Wear a helmet and other protective equipment during sports activities. If you have firearms in your house, make sure you follow all gun safety procedures. Seek help if you have been physically or sexually abused. What's next? Visit your health care provider once a year for an annual wellness visit. Ask your health care provider how often you should have your eyes and teeth checked. Stay up to date on all vaccines. This information is not intended to replace advice given to you by your health care provider. Make sure you discuss any questions you have with your health care provider. Document Revised: 06/13/2021 Document Reviewed: 06/13/2021 Elsevier Patient Education  [place].

## 2022-09-13 ENCOUNTER — Telehealth (HOSPITAL_BASED_OUTPATIENT_CLINIC_OR_DEPARTMENT_OTHER): Payer: Self-pay

## 2022-09-15 NOTE — Assessment & Plan Note (Signed)
RRR today 

## 2022-09-15 NOTE — Assessment & Plan Note (Signed)
Encourage heart healthy diet such as MIND or DASH diet, increase exercise, avoid trans fats, simple carbohydrates and processed foods, consider a krill or fish or flaxseed oil cap daily.  °

## 2022-09-15 NOTE — Progress Notes (Signed)
Subjective:    Patient ID: Kristen Dunlap, female    DOB: [birth year], 61 y.o.   MRN: [id number]  Chief Complaint  Patient presents with   Annual Exam    Here for CPE    HPI Patient is in today for annual preventative exam. No recent febrile illness or acute hospitalizations. She feels well today. She tries to stay active. She maintains a heart healthy diet. She hydrates well. Denies CP/palp/SOB/HA/congestion/fevers/GI or GU c/o. Taking meds as prescribed   Past Medical History:  Diagnosis Date   Anemia 11/09/2015   Chronic kidney disease    kidney stones    Fever blister 03/09/2013   Heel spur    right   Hx of cold sores 03/09/2013   Other and unspecified hyperlipidemia 03/14/2013   Overweight(278.02)    Personal history of kidney stones    Postmenopausal    Postmenopausal bleeding 10/01/2011    Past Surgical History:  Procedure Laterality Date   [place] and 1991   X 2   COLONOSCOPY  04/28/2020   [facility]   HYSTEROSCOPY  11/29/2011   Procedure: HYSTEROSCOPY;  Surgeon: Terrance Mass, MD;  Location: [facility];  Service: Gynecology;  Laterality: N/A;  with Myosure   POLYPECTOMY     TONSILLECTOMY  AS CHILD   TUBAL LIGATION  24 YRS AGO    Family History  Problem Relation Age of Onset   Diabetes Father    Cancer Father        throat ca/ smoker   Dementia Father    Esophageal cancer Father    Cancer Paternal Grandmother        esophagus, cancer   Other Sister 44       PLS   Early death Sister    Colon cancer Neg Hx    Colon polyps Neg Hx    Rectal cancer Neg Hx    Stomach cancer Neg Hx     Social History   Socioeconomic History   Marital status: Married    Spouse name: Not on file   Number of children: Not on file   Years of education: Not on file   Highest education level: Not on file  Occupational History   Not on file  Tobacco Use   Smoking status: Never   Smokeless tobacco: Never  Vaping Use    Vaping Use: Never used  Substance and Sexual Activity   Alcohol use: Yes    Comment: occasionaly   Drug use: No   Sexual activity: Yes    Partners: Male    Comment: tubes tied, lives with husband, works in office setting, Press photographer, 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     Multiple Vitamin (MULTIVITAMIN WITH MINERALS) TABS tablet Take 1 tablet by mouth daily.     polyethylene glycol (MIRALAX / GLYCOLAX) packet Take 17 g by mouth daily. Mix in 8 oz liquid and drink     Facility-Administered Medications Prior  to Visit  Medication Dose Route Frequency Provider Last Rate Last Admin   0.9 %  sodium chloride infusion  500 mL Intravenous Once Milus Banister, MD        Allergies  Allergen Reactions   Chlorhexidine Rash    Review of Systems  Constitutional:  Negative for chills, fever and malaise/fatigue.  HENT:  Negative for congestion and hearing loss.   Eyes:  Negative for discharge.  Respiratory:  Negative for cough, sputum production and shortness of breath.   Cardiovascular:  Negative for chest pain, palpitations and leg swelling.  Gastrointestinal:  Negative for abdominal pain, blood in stool, constipation, diarrhea, heartburn, nausea and vomiting.  Genitourinary:  Negative for dysuria, frequency, hematuria and urgency.  Musculoskeletal:  Negative for back pain, falls and myalgias.  Skin:  Negative for rash.  Neurological:  Negative for dizziness, sensory change, loss of consciousness, weakness and headaches.  Endo/Heme/Allergies:  Negative  for environmental allergies. Does not bruise/bleed easily.  Psychiatric/Behavioral:  Negative for depression and suicidal ideas. The patient is not nervous/anxious and does not have insomnia.        Objective:    Physical Exam Constitutional:      General: She is not in acute distress.    Appearance: She is well-developed.  HENT:     Head: Normocephalic and atraumatic.  Eyes:     Conjunctiva/sclera: Conjunctivae normal.  Neck:     Thyroid: No thyromegaly.  Cardiovascular:     Rate and Rhythm: Normal rate and regular rhythm.     Heart sounds: Normal heart sounds. No murmur heard. Pulmonary:     Effort: Pulmonary effort is normal. No respiratory distress.     Breath sounds: Normal breath sounds.  Abdominal:     General: Bowel sounds are normal. There is no distension.     Palpations: Abdomen is soft. There is no mass.     Tenderness: There is no abdominal tenderness.  Musculoskeletal:     Cervical back: Neck supple.  Lymphadenopathy:     Cervical: No cervical adenopathy.  Skin:    General: Skin is warm and dry.  Neurological:     Mental Status: She is alert and oriented to person, place, and time.  Psychiatric:        Behavior: Behavior normal.     BP 115/64 (BP Location: Right Arm, Patient Position: Sitting, Cuff Size: Normal)   Pulse 64   Temp 98 F (36.7 C) (Oral)   Resp 16   Ht '5\' 6"'$  (1.676 m)   Wt 177 lb 9.6 oz (80.6 kg)   LMP 11/28/2009   SpO2 95%   BMI 28.67 kg/m  Wt Readings from Last 3 Encounters:  09/12/22 177 lb 9.6 oz (80.6 kg)  03/01/21 177 lb 12.8 oz (80.6 kg)  04/28/20 174 lb (78.9 kg)    Diabetic Foot Exam - Simple   No data filed    Lab Results  Component Value Date   WBC 5.5 09/12/2022   HGB 13.4 09/12/2022   HCT 40.3 09/12/2022   PLT 303.0 09/12/2022   GLUCOSE 87 09/12/2022   CHOL 220 (H) 09/12/2022   TRIG 117.0 09/12/2022   HDL 67.60 09/12/2022   LDLDIRECT 115.0 03/25/2011   LDLCALC 129 (H) 09/12/2022   ALT 10 09/12/2022    AST 11 09/12/2022   NA 142 09/12/2022   K 4.6 09/12/2022   CL 108 09/12/2022   CREATININE 0.84 09/12/2022   BUN 13 09/12/2022   CO2 28 09/12/2022  TSH 3.95 09/12/2022   INR 0.87 09/30/2011    Lab Results  Component Value Date   TSH 3.95 09/12/2022   Lab Results  Component Value Date   WBC 5.5 09/12/2022   HGB 13.4 09/12/2022   HCT 40.3 09/12/2022   MCV 88.9 09/12/2022   PLT 303.0 09/12/2022   Lab Results  Component Value Date   NA 142 09/12/2022   K 4.6 09/12/2022   CO2 28 09/12/2022   GLUCOSE 87 09/12/2022   BUN 13 09/12/2022   CREATININE 0.84 09/12/2022   BILITOT 0.8 09/12/2022   ALKPHOS 92 09/12/2022   AST 11 09/12/2022   ALT 10 09/12/2022   PROT 6.8 09/12/2022   ALBUMIN 4.2 09/12/2022   CALCIUM 11.0 (H) 09/12/2022   ANIONGAP 6 08/02/2017   GFR 75.73 09/12/2022   Lab Results  Component Value Date   CHOL 220 (H) 09/12/2022   Lab Results  Component Value Date   HDL 67.60 09/12/2022   Lab Results  Component Value Date   LDLCALC 129 (H) 09/12/2022   Lab Results  Component Value Date   TRIG 117.0 09/12/2022   Lab Results  Component Value Date   CHOLHDL 3 09/12/2022   No results found for: "HGBA1C"     Assessment & Plan:   Problem List Items Addressed This Visit     Preventative health care - Primary    Patient encouraged to maintain heart healthy diet, regular exercise, adequate sleep. Consider daily probiotics. Take medications as prescribed. Labs ordered and reviewed. Flu shot given today. [facility]. Colonoscopy 2021 repeat in 2031. Pap March 2022 repeat in 2025      Relevant Orders   TSH (Completed)   Hyperlipidemia    Encourage heart healthy diet such as MIND or DASH diet, increase exercise, avoid trans fats, simple carbohydrates and processed foods, consider a krill or fish or flaxseed oil cap daily.       Relevant Orders   Comprehensive metabolic panel (Completed)   Lipid panel (Completed)   TSH (Completed)    Anemia   Relevant Orders   CBC (Completed)   Atrial fibrillation with RVR (HCC)    RRR today      Other Visit Diagnoses     Post-menopausal       Relevant Orders   DG Bone Density   Estrogen deficiency       Relevant Orders   DG Bone Density   Need for influenza vaccination       Relevant Orders   Flu Vaccine QUAD 6+ mos PF IM (Fluarix Quad PF) (Completed)       I am having Kristen Dunlap maintain her multivitamin with minerals, polyethylene glycol, and KRILL OIL PO. We will continue to administer sodium chloride.  No orders of the defined types were placed in this encounter.    Penni Homans, MD

## 2022-09-23 ENCOUNTER — Encounter: Payer: Self-pay | Admitting: Family Medicine

## 2022-10-22 ENCOUNTER — Ambulatory Visit (HOSPITAL_BASED_OUTPATIENT_CLINIC_OR_DEPARTMENT_OTHER)
Admission: RE | Admit: 2022-10-22 | Discharge: 2022-10-22 | Disposition: A | Discharge status: Home / Self Care | Payer: 59 | Source: Ambulatory Visit | Attending: Family Medicine | Admitting: Family Medicine

## 2022-10-22 DIAGNOSIS — E2839 Other primary ovarian failure: Secondary | ICD-10-CM | POA: Insufficient documentation

## 2022-10-22 DIAGNOSIS — Z780 Asymptomatic menopausal state: Secondary | ICD-10-CM | POA: Insufficient documentation

## 2022-11-18 ENCOUNTER — Emergency Department (HOSPITAL_BASED_OUTPATIENT_CLINIC_OR_DEPARTMENT_OTHER): Payer: 59

## 2022-11-18 ENCOUNTER — Other Ambulatory Visit: Payer: Self-pay

## 2022-11-18 ENCOUNTER — Emergency Department (HOSPITAL_BASED_OUTPATIENT_CLINIC_OR_DEPARTMENT_OTHER)
Admission: EM | Admit: 2022-11-18 | Discharge: 2022-11-18 | Disposition: A | Discharge status: Home / Self Care | Payer: 59 | Attending: Emergency Medicine | Admitting: Emergency Medicine

## 2022-11-18 ENCOUNTER — Encounter (HOSPITAL_BASED_OUTPATIENT_CLINIC_OR_DEPARTMENT_OTHER): Payer: Self-pay | Admitting: Emergency Medicine

## 2022-11-18 DIAGNOSIS — N132 Hydronephrosis with renal and ureteral calculous obstruction: Secondary | ICD-10-CM | POA: Diagnosis not present

## 2022-11-18 DIAGNOSIS — E876 Hypokalemia: Secondary | ICD-10-CM | POA: Diagnosis not present

## 2022-11-18 DIAGNOSIS — N200 Calculus of kidney: Secondary | ICD-10-CM

## 2022-11-18 DIAGNOSIS — R109 Unspecified abdominal pain: Secondary | ICD-10-CM | POA: Diagnosis present

## 2022-11-18 LAB — URINALYSIS, ROUTINE W REFLEX MICROSCOPIC
Bilirubin Urine: NEGATIVE
Glucose, UA: NEGATIVE mg/dL
Ketones, ur: NEGATIVE mg/dL
Leukocytes,Ua: NEGATIVE
Nitrite: NEGATIVE
Protein, ur: 30 mg/dL — AB
Specific Gravity, Urine: >=1.03 (ref 1.005–1.030)
pH: 5.5 (ref 5.0–8.0)

## 2022-11-18 LAB — CBC WITH DIFFERENTIAL/PLATELET
Abs Immature Granulocytes: 0.04 10*3/uL (ref 0.00–0.07)
Basophils Absolute: 0.1 10*3/uL (ref 0.0–0.1)
Basophils Relative: 0 %
Eosinophils Absolute: 0.1 10*3/uL (ref 0.0–0.5)
Eosinophils Relative: 1 %
HCT: 38.6 % (ref 36.0–46.0)
Hemoglobin: 12.4 g/dL (ref 12.0–15.0)
Immature Granulocytes: 0 %
Lymphocytes Relative: 11 %
Lymphs Abs: 1.5 10*3/uL (ref 0.7–4.0)
MCH: 28.6 pg (ref 26.0–34.0)
MCHC: 32.1 g/dL (ref 30.0–36.0)
MCV: 89.1 fL (ref 80.0–100.0)
Monocytes Absolute: 1.3 10*3/uL — ABNORMAL HIGH (ref 0.1–1.0)
Monocytes Relative: 10 %
Neutro Abs: 10 10*3/uL — ABNORMAL HIGH (ref 1.7–7.7)
Neutrophils Relative %: 78 %
Platelets: 255 10*3/uL (ref 150–400)
RBC: 4.33 MIL/uL (ref 3.87–5.11)
RDW: 13.1 % (ref 11.5–15.5)
WBC: 13 10*3/uL — ABNORMAL HIGH (ref 4.0–10.5)
nRBC: 0 % (ref 0.0–0.2)

## 2022-11-18 LAB — COMPREHENSIVE METABOLIC PANEL
ALT: 19 U/L (ref 0–44)
AST: 20 U/L (ref 15–41)
Albumin: 4.1 g/dL (ref 3.5–5.0)
Alkaline Phosphatase: 88 U/L (ref 38–126)
Anion gap: 7 (ref 5–15)
BUN: 21 mg/dL — ABNORMAL HIGH (ref 6–20)
CO2: 25 mmol/L (ref 22–32)
Calcium: 11.2 mg/dL — ABNORMAL HIGH (ref 8.9–10.3)
Chloride: 108 mmol/L (ref 98–111)
Creatinine, Ser: 1.32 mg/dL — ABNORMAL HIGH (ref 0.44–1.00)
GFR, Estimated: 46 mL/min — ABNORMAL LOW (ref >60)
Glucose, Bld: 96 mg/dL (ref 70–99)
Potassium: 3.4 mmol/L — ABNORMAL LOW (ref 3.5–5.1)
Sodium: 140 mmol/L (ref 135–145)
Total Bilirubin: 0.8 mg/dL (ref 0.3–1.2)
Total Protein: 6.9 g/dL (ref 6.5–8.1)

## 2022-11-18 LAB — URINALYSIS, MICROSCOPIC (REFLEX)

## 2022-11-18 LAB — PREGNANCY, URINE: Preg Test, Ur: NEGATIVE

## 2022-11-18 LAB — LIPASE, BLOOD: Lipase: 36 U/L (ref 11–51)

## 2022-11-18 MED ORDER — POTASSIUM CHLORIDE CRYS ER 20 MEQ PO TBCR
40.0000 meq | EXTENDED_RELEASE_TABLET | Freq: Once | ORAL | Status: AC
  Administered 2022-11-18: 40 meq via ORAL
  Filled 2022-11-18: qty 2

## 2022-11-18 MED ORDER — TAMSULOSIN HCL 0.4 MG PO CAPS: 0.4000 mg | ORAL_CAPSULE | Freq: Every day | ORAL | 0 refills | Status: DC

## 2022-11-18 MED ORDER — HYDROCODONE-ACETAMINOPHEN 5-325 MG PO TABS: 1.0000 | ORAL_TABLET | Freq: Four times a day (QID) | ORAL | 0 refills | Status: AC | PRN

## 2022-11-18 MED ORDER — KETOROLAC TROMETHAMINE 15 MG/ML IJ SOLN
15.0000 mg | Freq: Once | INTRAMUSCULAR | Status: AC
  Administered 2022-11-18: 15 mg via INTRAVENOUS
  Filled 2022-11-18: qty 1

## 2022-11-18 MED ORDER — SODIUM CHLORIDE 0.9 % IV BOLUS
1000.0000 mL | Freq: Once | INTRAVENOUS | Status: AC
  Administered 2022-11-18: 1000 mL via INTRAVENOUS

## 2022-11-18 MED ORDER — MAGNESIUM SULFATE 2 GM/50ML IV SOLN
2.0000 g | Freq: Once | INTRAVENOUS | Status: AC
  Administered 2022-11-18: 2 g via INTRAVENOUS
  Filled 2022-11-18: qty 50

## 2022-11-18 NOTE — ED Triage Notes (Signed)
Patient presents to ED via POV from home. Here with LLQ pain that began on Thursday. Endorses nausea, denies vomiting. Reports history of kidney stones and this feels similar.

## 2022-11-18 NOTE — Discharge Instructions (Addendum)
As we discussed, CT imaging of your abdomen shows a 6 mm kidney stone on your left side.  This is likely the cause of your pain.  I have given you a prescription for Flomax to help you pass the stone.  You should take this every day while you are symptomatic.  I also given you a prescription for narcotic pain medication to help with your symptoms.  Please take this as prescribed as needed for severe pain only.  Do not drive or operate heavy machinery while taking this medication.  I have also given you a referral to urology with a number to call to schedule an appointment for continued evaluation and management of your symptoms.  Return if development of any new or worsening symptoms.

## 2022-11-18 NOTE — ED Provider Notes (Signed)
[place] EMERGENCY DEPARTMENT Provider Note   CSN: [id number] Arrival date & time: 11/18/22  [id number]     History  Chief Complaint  Patient presents with   Abdominal Pain    Kristen Dunlap is a 61 y.o. female.  Patient with history of kidney stones presents today with complaints of left flank pain.  She states that same began initially on Thursday and has been intermittent since.  She states this feels like kidney stone she has had previously.  Pain waxes and wanes.  Endorses episodes of pink-colored urine concerning for blood.  Denies any vomiting or diarrhea.  Denies any dysuria.  No fevers or chills. Denies any history of abdominal surgeries.   The history is provided by the patient. No language interpreter was used.  Abdominal Pain Associated symptoms: nausea        Home Medications Prior to Admission medications   Medication Sig Start Date End Date Taking? Authorizing Provider  KRILL OIL PO Take by mouth.    [provider]  Multiple Vitamin (MULTIVITAMIN WITH MINERALS) TABS tablet Take 1 tablet by mouth daily.    [provider]  polyethylene glycol (MIRALAX / GLYCOLAX) packet Take 17 g by mouth daily. Mix in 8 oz liquid and drink    [provider]      Allergies    Chlorhexidine    Review of Systems   Review of Systems  Gastrointestinal:  Positive for nausea.  Genitourinary:  Positive for flank pain.  All other systems reviewed and are negative.   Physical Exam Updated Vital Signs BP (!) 157/93   Pulse 74   Temp 97.6 F (36.4 C) (Oral)   Resp 16   LMP 11/28/2009   SpO2 100%  Physical Exam Vitals and nursing note reviewed.  Constitutional:      General: She is not in acute distress.    Appearance: Normal appearance. She is normal weight. She is not ill-appearing, toxic-appearing or diaphoretic.  HENT:     Head: Normocephalic and atraumatic.  Cardiovascular:     Rate and Rhythm: Normal rate.  Pulmonary:      Effort: Pulmonary effort is normal. No respiratory distress.  Abdominal:     General: Abdomen is flat.     Palpations: Abdomen is soft.     Tenderness: There is left CVA tenderness.  Musculoskeletal:        General: Normal range of motion.     Cervical back: Normal range of motion.  Skin:    General: Skin is warm and dry.  Neurological:     General: No focal deficit present.     Mental Status: She is alert.  Psychiatric:        Mood and Affect: Mood normal.        Behavior: Behavior normal.     ED Results / Procedures / Treatments   Labs (all labs ordered are listed, but only abnormal results are displayed) Labs Reviewed  CBC WITH DIFFERENTIAL/PLATELET - Abnormal; Notable for the following components:      Result Value   WBC 13.0 (*)    Neutro Abs 10.0 (*)    Monocytes Absolute 1.3 (*)    All other components within normal limits  URINALYSIS, ROUTINE W REFLEX MICROSCOPIC - Abnormal; Notable for the following components:   Hgb urine dipstick TRACE (*)    Protein, ur 30 (*)    All other components within normal limits  URINALYSIS, MICROSCOPIC (REFLEX) - Abnormal; Notable for the following  components:   Bacteria, UA RARE (*)    All other components within normal limits  PREGNANCY, URINE  COMPREHENSIVE METABOLIC PANEL  LIPASE, BLOOD    EKG None  Radiology CT Renal Stone Study  Result Date: 11/18/2022 CLINICAL DATA:  Left flank pain EXAM: CT ABDOMEN AND PELVIS WITHOUT CONTRAST TECHNIQUE: Multidetector CT imaging of the abdomen and pelvis was performed following the standard protocol without IV contrast. RADIATION DOSE REDUCTION: This exam was performed according to the departmental dose-optimization program which includes automated exposure control, adjustment of the mA and/or kV according to patient size and/or use of iterative reconstruction technique. COMPARISON:  None Available. FINDINGS: Lower chest: No acute abnormality. Hepatobiliary: No focal liver abnormality is  seen. No gallstones, gallbladder wall thickening, or biliary dilatation. Pancreas: Unremarkable. No pancreatic ductal dilatation or surrounding inflammatory changes. Spleen: Normal in size without focal abnormality. Adrenals/Urinary Tract: Moderate left hydronephrosis with obstructing 6 mm stone seen of the left ureteropelvic junction on series 2, image 38. Mild left perinephric and periureteral fat stranding. Numerous additional nonobstructing renal stones are seen bilaterally. Bladder is decompressed. Stomach/Bowel: Stomach is within normal limits. Appendix appears normal. No evidence of bowel wall thickening, distention, or inflammatory changes. Vascular/Lymphatic: Aortic atherosclerosis. No enlarged abdominal or pelvic lymph nodes. Reproductive: Uterus and bilateral adnexa are unremarkable. Other: No abdominal wall hernia or abnormality. No abdominopelvic ascites. Musculoskeletal: No acute or significant osseous findings. IMPRESSION: 1. Moderate left hydronephrosis with obstructing 6 mm stone of the left ureteropelvic junction. 2. Numerous additional nonobstructing renal stones are seen bilaterally. 3. Aortic Atherosclerosis (ICD10-I70.0). Electronically Signed   By: Yetta Glassman M.D.   On: 11/18/2022 10:23    Procedures Procedures    Medications Ordered in ED Medications  ketorolac (TORADOL) 15 MG/ML injection 15 mg (has no administration in time range)  magnesium sulfate IVPB 2 g 50 mL (0 g Intravenous Stopped 11/18/22 1046)    ED Course/ Medical Decision Making/ A&P                           Medical Decision Making Amount and/or Complexity of Data Reviewed Labs: ordered. Radiology: ordered.  Risk Prescription drug management.   This patient is a 61 y.o. female who presents to the ED for concern of left flank pain, this involves an extensive number of treatment options, and is a complaint that carries with it a high risk of complications and morbidity. The emergent differential  diagnosis prior to evaluation includes, but is not limited to,  The differential diagnosis for generalized abdominal pain includes, but is not limited to kidney stone, AAA, Bowel obstruction, Bowel perforation. Gastroparesis, DKA, Hernia, Inflammatory bowel disease, mesenteric ischemia, pancreatitis, volvulus.  This is not an exhaustive differential.   Past Medical History / Co-morbidities / Social History: Hx kidney stones  Physical Exam: Physical exam performed. The pertinent findings include: No abdominal tenderness, + CVA tenderness  Lab Tests: I ordered, and personally interpreted labs.  The pertinent results include:  WBC 13.0, K 3.4, creatinine 1.32 up from 0.84 2 months ago. UA with trace hgb, proteinuria.   Imaging Studies: I ordered imaging studies including CT renal. I independently visualized and interpreted imaging which showed   1. Moderate left hydronephrosis with obstructing 6 mm stone of the left ureteropelvic junction. 2. Numerous additional nonobstructing renal stones are seen bilaterally.  I agree with the radiologist interpretation.    Medications: I ordered medication including magnesium and toradol for pain, fluids for dehydration,  potassium for hypokalemia. Reevaluation of the patient after these medicines showed that the patient improved. I have reviewed the patients home medicines and have made adjustments as needed.    Disposition:  Patient presents today with left flank pain x4 days.  She is afebrile, nontoxic-appearing, no acute distress with reassuring vital signs.  Physical exam reveals mild left CVA tenderness.  CT imaging reveals 6 mm obstructing left-sided kidney stone with moderate hydronephrosis.  Patient's creatinine is mildly elevated.  Work-up reveals no concerns for infected kidney stone.  After Toradol and magnesium her pain is completely resolved.  She is therefore stable to be discharged with urology follow-up outpatient.  Will send with Flomax  and Norco.  PDMP reviewed.  Patient is understanding and amenable with plan, educated on red flag symptoms that would prompt immediate return.  Patient discharged in stable condition.    I discussed this case with my attending physician Dr. Melina Copa who cosigned this note including patient's presenting symptoms, physical exam, and planned diagnostics and interventions. Attending physician stated agreement with plan or made changes to plan which were implemented.     Final Clinical Impression(s) / ED Diagnoses Final diagnoses:  Kidney stone on left side    Rx / DC Orders ED Discharge Orders          Ordered    tamsulosin (FLOMAX) 0.4 MG CAPS capsule  Daily        11/18/22 1322    HYDROcodone-acetaminophen (NORCO/VICODIN) 5-325 MG tablet  Every 6 hours PRN        11/18/22 1322          An After Visit Summary was printed and given to the patient.     Nestor Lewandowsky 11/18/22 1324    Hayden Rasmussen, MD 11/19/22 [phone]

## 2023-03-21 LAB — HM MAMMOGRAPHY

## 2023-07-16 ENCOUNTER — Emergency Department (HOSPITAL_COMMUNITY)
Admission: EM | Admit: 2023-07-16 | Discharge: 2023-07-16 | Disposition: A | Discharge status: Home / Self Care | Payer: 59 | Attending: Emergency Medicine | Admitting: Emergency Medicine

## 2023-07-16 ENCOUNTER — Encounter (HOSPITAL_COMMUNITY): Payer: Self-pay

## 2023-07-16 ENCOUNTER — Other Ambulatory Visit: Payer: Self-pay

## 2023-07-16 ENCOUNTER — Emergency Department (HOSPITAL_COMMUNITY): Payer: 59

## 2023-07-16 DIAGNOSIS — S8992XA Unspecified injury of left lower leg, initial encounter: Secondary | ICD-10-CM | POA: Diagnosis present

## 2023-07-16 DIAGNOSIS — S81812A Laceration without foreign body, left lower leg, initial encounter: Secondary | ICD-10-CM

## 2023-07-16 DIAGNOSIS — Z23 Encounter for immunization: Secondary | ICD-10-CM | POA: Diagnosis not present

## 2023-07-16 DIAGNOSIS — Y92410 Unspecified street and highway as the place of occurrence of the external cause: Secondary | ICD-10-CM | POA: Diagnosis not present

## 2023-07-16 MED ORDER — BACITRACIN ZINC 500 UNIT/GM EX OINT
TOPICAL_OINTMENT | Freq: Two times a day (BID) | CUTANEOUS | Status: DC
  Administered 2023-07-16: 1 via TOPICAL
  Filled 2023-07-16: qty 1.8

## 2023-07-16 MED ORDER — LIDOCAINE-EPINEPHRINE (PF) 2 %-1:200000 IJ SOLN
10.0000 mL | Freq: Once | INTRAMUSCULAR | Status: AC
  Administered 2023-07-16: 10 mL
  Filled 2023-07-16: qty 20

## 2023-07-16 MED ORDER — TETANUS-DIPHTH-ACELL PERTUSSIS 5-2.5-18.5 LF-MCG/0.5 IM SUSY
0.5000 mL | PREFILLED_SYRINGE | Freq: Once | INTRAMUSCULAR | Status: AC
  Administered 2023-07-16: 0.5 mL via INTRAMUSCULAR
  Filled 2023-07-16: qty 0.5
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
## 2023-07-16 NOTE — ED Notes (Signed)
Left arm wrapped in telfa and kling. Left leg laceration cleaned and awaiting sutures

## 2023-08-14 ENCOUNTER — Encounter (INDEPENDENT_AMBULATORY_CARE_PROVIDER_SITE_OTHER): Payer: Self-pay

## 2023-09-16 ENCOUNTER — Encounter: Payer: 59 | Admitting: Family Medicine

## 2024-01-27 ENCOUNTER — Encounter: Payer: Self-pay | Admitting: Family

## 2024-01-27 ENCOUNTER — Ambulatory Visit (INDEPENDENT_AMBULATORY_CARE_PROVIDER_SITE_OTHER): Payer: 59 | Admitting: Family

## 2024-01-27 VITALS — BP 138/86 | HR 75 | Ht 66.0 in | Wt 181.6 lb

## 2024-01-27 DIAGNOSIS — R7309 Other abnormal glucose: Secondary | ICD-10-CM

## 2024-01-27 DIAGNOSIS — Z0000 Encounter for general adult medical examination without abnormal findings: Secondary | ICD-10-CM | POA: Diagnosis not present

## 2024-01-27 DIAGNOSIS — Z13220 Encounter for screening for lipoid disorders: Secondary | ICD-10-CM | POA: Diagnosis not present

## 2024-01-27 LAB — CBC WITH DIFFERENTIAL/PLATELET
Basophils Absolute: 0 10*3/uL (ref 0.0–0.1)
Basophils Relative: 0.5 % (ref 0.0–3.0)
Eosinophils Absolute: 0.2 10*3/uL (ref 0.0–0.7)
Eosinophils Relative: 2.5 % (ref 0.0–5.0)
HCT: 41.4 % (ref 36.0–46.0)
Hemoglobin: 13.8 g/dL (ref 12.0–15.0)
Lymphocytes Relative: 29.2 % (ref 12.0–46.0)
Lymphs Abs: 1.8 10*3/uL (ref 0.7–4.0)
MCHC: 33.3 g/dL (ref 30.0–36.0)
MCV: 87.2 fL (ref 78.0–100.0)
Monocytes Absolute: 0.5 10*3/uL (ref 0.1–1.0)
Monocytes Relative: 7.5 % (ref 3.0–12.0)
Neutro Abs: 3.8 10*3/uL (ref 1.4–7.7)
Neutrophils Relative %: 60.3 % (ref 43.0–77.0)
Platelets: 292 10*3/uL (ref 150.0–400.0)
RBC: 4.75 Mil/uL (ref 3.87–5.11)
RDW: 13.9 % (ref 11.5–15.5)
WBC: 6.3 10*3/uL (ref 4.0–10.5)

## 2024-01-27 LAB — COMPREHENSIVE METABOLIC PANEL
ALT: 11 U/L (ref 0–35)
AST: 12 U/L (ref 0–37)
Albumin: 4.3 g/dL (ref 3.5–5.2)
Alkaline Phosphatase: 69 U/L (ref 39–117)
BUN: 18 mg/dL (ref 6–23)
CO2: 27 meq/L (ref 19–32)
Calcium: 10.3 mg/dL (ref 8.4–10.5)
Chloride: 108 meq/L (ref 96–112)
Creatinine, Ser: 0.92 mg/dL (ref 0.40–1.20)
GFR: 67.24 mL/min (ref >60.00)
Glucose, Bld: 95 mg/dL (ref 70–99)
Potassium: 4.7 meq/L (ref 3.5–5.1)
Sodium: 140 meq/L (ref 135–145)
Total Bilirubin: 0.9 mg/dL (ref 0.2–1.2)
Total Protein: 6.4 g/dL (ref 6.0–8.3)

## 2024-01-27 LAB — LIPID PANEL
Cholesterol: 243 mg/dL — ABNORMAL HIGH (ref 0–200)
HDL: 57.6 mg/dL (ref >39.00)
LDL Cholesterol: 152 mg/dL — ABNORMAL HIGH (ref 0–99)
NonHDL: 185.05
Total CHOL/HDL Ratio: 4
Triglycerides: 166 mg/dL — ABNORMAL HIGH (ref 0.0–149.0)
VLDL: 33.2 mg/dL (ref 0.0–40.0)

## 2024-01-27 LAB — HEMOGLOBIN A1C: Hgb A1c MFr Bld: 6.1 % (ref 4.6–6.5)

## 2024-01-27 NOTE — Progress Notes (Signed)
Kristen Dunlap is a 63 y.o. 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     Multiple Vitamin (MULTIVITAMIN WITH MINERALS) TABS tablet Take 1 tablet by mouth daily.     polyethylene glycol (MIRALAX / GLYCOLAX) packet Take 17 g by mouth daily. Mix in 8 oz liquid and drink     No current facility-administered medications for this visit.    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   Other and unspecified hyperlipidemia 03/14/2013   Overweight(278.02)    Personal history of kidney stones    Postmenopausal    Postmenopausal bleeding 10/01/2011    Past Surgical History:  Procedure Laterality Date   CESAREAN SECTION  1989 and 1991   X 2   COLONOSCOPY  04/28/2020   HEEL SPUR SURGERY  1998   HYSTEROSCOPY  11/29/2011   Procedure: HYSTEROSCOPY;  Surgeon: Ok Edwards, MD;  Location: [facility];  Service: Gynecology;  Laterality: N/A;  with Myosure   POLYPECTOMY     TONSILLECTOMY  AS CHILD   TUBAL LIGATION  15 YRS AGO    Family History  Problem Relation Age of Onset   Diabetes Father    Cancer Father        throat ca/ smoker   Dementia Father    Esophageal cancer Father    Cancer Paternal Grandmother         esophagus, cancer   Other Sister 82       PLS   Early death Sister    Colon cancer Neg Hx    Colon polyps Neg Hx    Rectal cancer Neg Hx    Stomach cancer Neg Hx     Social History   Tobacco Use   Smoking status: Never   Smokeless tobacco: Never  Substance Use Topics   Alcohol use: Yes    Comment: occasionaly    Subjective:   Presents for yearly CPE; her PCP not available today; no acute concerns; up to date on dental and vision exams; due for pap smear and bone density later this year;   Review of Systems  Constitutional: Negative.   HENT: Negative.    Eyes: Negative.   Respiratory: Negative.    Cardiovascular: Negative.   Gastrointestinal: Negative.   Genitourinary: Negative.   Musculoskeletal: Negative.   Skin: Negative.   Neurological: Negative.   Endo/Heme/Allergies: Negative.   Psychiatric/Behavioral: Negative.       Objective:  Vitals:   01/27/24 0843  BP: 138/86  Pulse: 75  SpO2: 98%  Weight: 181 lb 9.6 oz (82.4 kg)  Height: 5\' 6"  (1.676 m)    General: Well developed,  well nourished, in no acute distress  Skin : Warm and dry.  Head: Normocephalic and atraumatic  Eyes: Sclera and conjunctiva clear; pupils round and reactive to light; extraocular movements intact  Ears: External normal; canals clear; tympanic membranes normal  Oropharynx: Pink, supple. No suspicious lesions  Neck: Supple without thyromegaly, adenopathy  Lungs: Respirations unlabored; clear to auscultation bilaterally without wheeze, rales, rhonchi  CVS exam: normal rate and regular rhythm.  Abdomen: Soft; nontender; nondistended; normoactive bowel sounds; no masses or hepatosplenomegaly  Musculoskeletal: No deformities; no active joint inflammation  Extremities: No edema, cyanosis, clubbing  Vessels: Symmetric bilaterally  Neurologic: Alert and oriented; speech intact; face symmetrical; moves all extremities well; CNII-XII intact without focal deficit   Assessment:  1. PE (physical  exam), annual   2. Lipid screening   3. Elevated glucose     Plan:  Age appropriate preventive healthcare needs addressed; encouraged regular eye doctor and dental exams; encouraged regular exercise; will update labs and refills as needed today; follow-up to be determined; She will plan to get her pap smear and bone density discussion with her PCP later this year;  Discussed Prevnar 20 but will hold today as our billing team still working out billing questions- she can re-visit with her PCP later this year;   Return in about 3 months (around 04/26/2024) for  follow up Dr. Abner Greenspan pap smear; has not seen Dr. Abner Greenspan since 2023.  Orders Placed This Encounter  Procedures   CBC with Differential/Platelet   Comp Met (CMET)   Lipid panel   Hemoglobin A1c    Requested Prescriptions    No prescriptions requested or ordered in this encounter

## 2024-02-19 ENCOUNTER — Encounter: Payer: 59 | Admitting: Family Medicine

## 2024-03-26 LAB — HM MAMMOGRAPHY

## 2024-03-30 ENCOUNTER — Encounter: Payer: Self-pay | Admitting: Family Medicine

## 2025-02-03 ENCOUNTER — Encounter: Payer: 59 | Admitting: Family Medicine

## 2025-05-20 ENCOUNTER — Encounter: Admitting: Student
